# Patient Record
Sex: Male | Born: 1980
Health system: Southern US, Community
[De-identification: ages and names within clinical notes are randomized; demographics above are authoritative.]

## PROBLEM LIST (undated history)

## (undated) DIAGNOSIS — R109 Unspecified abdominal pain: Secondary | ICD-10-CM

## (undated) DIAGNOSIS — R079 Chest pain, unspecified: Secondary | ICD-10-CM

## (undated) DIAGNOSIS — R509 Fever, unspecified: Secondary | ICD-10-CM

## (undated) DIAGNOSIS — L600 Ingrowing nail: Secondary | ICD-10-CM

## (undated) HISTORY — DX: Fever, unspecified: R50.9

## (undated) HISTORY — DX: Unspecified abdominal pain: R10.9

## (undated) HISTORY — PX: TONSILLECTOMY: SUR1361

---
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
## 2009-02-26 ENCOUNTER — Emergency Department (HOSPITAL_BASED_OUTPATIENT_CLINIC_OR_DEPARTMENT_OTHER): Admission: EM | Admit: 2009-02-26 | Discharge: 2009-02-26 | Payer: Self-pay | Admitting: Emergency Medicine

## 2010-10-01 ENCOUNTER — Other Ambulatory Visit: Payer: Self-pay | Admitting: Sports Medicine

## 2010-10-01 DIAGNOSIS — M25519 Pain in unspecified shoulder: Secondary | ICD-10-CM

## 2010-10-10 ENCOUNTER — Ambulatory Visit
Admission: RE | Admit: 2010-10-10 | Discharge: 2010-10-10 | Disposition: A | Discharge status: Home / Self Care | Payer: Self-pay | Source: Ambulatory Visit | Attending: Sports Medicine | Admitting: Sports Medicine

## 2010-10-10 ENCOUNTER — Ambulatory Visit
Admission: RE | Admit: 2010-10-10 | Discharge: 2010-10-10 | Disposition: A | Discharge status: Home / Self Care | Payer: 59 | Source: Ambulatory Visit | Attending: Sports Medicine | Admitting: Sports Medicine

## 2010-10-10 DIAGNOSIS — M25519 Pain in unspecified shoulder: Secondary | ICD-10-CM

## 2013-04-23 ENCOUNTER — Ambulatory Visit (INDEPENDENT_AMBULATORY_CARE_PROVIDER_SITE_OTHER): Payer: 59 | Admitting: Family Medicine

## 2013-04-23 ENCOUNTER — Encounter: Payer: Self-pay | Admitting: Family Medicine

## 2013-04-23 VITALS — BP 120/80 | HR 78 | Temp 97.5°F | Resp 18 | Wt 201.0 lb

## 2013-04-23 DIAGNOSIS — L600 Ingrowing nail: Secondary | ICD-10-CM

## 2013-04-23 DIAGNOSIS — L03031 Cellulitis of right toe: Secondary | ICD-10-CM

## 2013-04-23 DIAGNOSIS — L03039 Cellulitis of unspecified toe: Secondary | ICD-10-CM

## 2013-04-23 MED ORDER — HYDROCODONE-ACETAMINOPHEN 5-325 MG PO TABS: 1.0000 | ORAL_TABLET | Freq: Four times a day (QID) | ORAL | Status: DC | PRN

## 2013-04-23 MED ORDER — CEPHALEXIN 500 MG PO CAPS: 500.0000 mg | ORAL_CAPSULE | Freq: Two times a day (BID) | ORAL | Status: DC

## 2013-04-23 NOTE — Progress Notes (Signed)
  Subjective:    Patient ID: Eric Davis, male    DOB: 1980-12-30, 32 y.o.   MRN: [id number]  HPI Patient here with right great toe pain for the past 2 weeks. He went to the beach 2 weeks ago when he returned he noticed redness and swelling of his toe. He's been having yellow pus coming out from beneath the nail. He's been soaking the toe in Epsom salts and then he noticed the skin sloughing off. He has a lot of pain when he puts pressure around the toe nail or when he walks in his work boots. He denies any ankle pain or foot pain otherwise. He denies any fever or systemic symptoms.   Review of Systems  GEN- denies fatigue, fever, weight loss,weakness, recent illness MSK- + joint pain, muscle aches, injury       Objective:   Physical Exam  GEN-NAD,alert and oriented x 3 EXT- Right great toe- ingrown medial nail with erythema and dry pus at nailfolds both medially and on proximal nailfold, sloughing skin, TTP, FROM Toe joint, RIght ankle Pulse- DP 2+   Procedure- Partial Nail removal- Right Great ToeNail Procedure explained to patient questions answered benefits and risks discussed verbal consent obtained. Antiseptic-Betadine Anesthesia-lidocaine without epinephrine- digital block  Medial aspect of right great toenail -elevated and removed, minimal bleeding, no pus expressed Minimal blood loss Patient tolerated procedure well Vaseline Gauze and Bandage applied See instructions        Assessment & Plan:

## 2013-04-23 NOTE — Patient Instructions (Addendum)
Change bandage every 24 hours Use antibiotic cream to keep bandage from sticking Elevate foot to help with any swelling Take antibiotics by mouth and pain pills as needed F/U if anything gets worse   Infected Ingrown Toenail An infected ingrown toenail occurs when the nail edge grows into the skin and bacteria invade the area. Symptoms include pain, tenderness, swelling, and pus drainage from the edge of the nail. Poorly fitting shoes, minor injuries, and improper cutting of the toenail may also contribute to the problem. You should cut your toenails squarely instead of rounding the edges. Do not cut them too short. Avoid tight or pointed toe shoes. Sometimes the ingrown portion of the nail must be removed. If your toenail is removed, it can take 3-4 months for it to re-grow. HOME CARE INSTRUCTIONS   Soak your infected toe in warm water for 20-30 minutes, 2 to 3 times a day.  Packing or dressings applied to the area should be changed daily.  Take medicine as directed and finish them.  Reduce activities and keep your foot elevated when able to reduce swelling and discomfort. Do this until the infection gets better.  Wear sandals or go barefoot as much as possible while the infected area is sensitive.  See your caregiver for follow-up care in 2-3 days if the infection is not better. SEEK MEDICAL CARE IF:  Your toe is becoming more red, swollen or painful. MAKE SURE YOU:   Understand these instructions.  Will watch your condition.  Will get help right away if you are not doing well or get worse. Document Released: 09/05/2004 Document Revised: 10/21/2011 Document Reviewed: 07/25/2008 [facility] Patient Information 2014 [place], [place].

## 2013-04-25 DIAGNOSIS — L600 Ingrowing nail: Secondary | ICD-10-CM | POA: Insufficient documentation

## 2013-04-25 DIAGNOSIS — L03039 Cellulitis of unspecified toe: Secondary | ICD-10-CM

## 2013-04-25 HISTORY — DX: Ingrowing nail: L60.0

## 2013-04-25 HISTORY — DX: Cellulitis of unspecified toe: L03.039

## 2013-04-25 NOTE — Assessment & Plan Note (Signed)
Keflex, unable to express any gross pus

## 2013-04-25 NOTE — Assessment & Plan Note (Signed)
Partial removal of ingrown nail on infected side See instructions

## 2013-12-28 ENCOUNTER — Encounter: Payer: Self-pay | Admitting: Family Medicine

## 2013-12-28 ENCOUNTER — Emergency Department (HOSPITAL_BASED_OUTPATIENT_CLINIC_OR_DEPARTMENT_OTHER)
Admission: EM | Admit: 2013-12-28 | Discharge: 2013-12-29 | Disposition: A | Discharge status: Home / Self Care | Payer: 59 | Attending: Emergency Medicine | Admitting: Emergency Medicine

## 2013-12-28 ENCOUNTER — Encounter (HOSPITAL_BASED_OUTPATIENT_CLINIC_OR_DEPARTMENT_OTHER): Payer: Self-pay | Admitting: Emergency Medicine

## 2013-12-28 ENCOUNTER — Ambulatory Visit (INDEPENDENT_AMBULATORY_CARE_PROVIDER_SITE_OTHER): Payer: 59 | Admitting: Family Medicine

## 2013-12-28 VITALS — BP 100/80 | HR 69 | Temp 98.0°F | Resp 14 | Ht 74.0 in | Wt 200.0 lb

## 2013-12-28 DIAGNOSIS — K2990 Gastroduodenitis, unspecified, without bleeding: Principal | ICD-10-CM

## 2013-12-28 DIAGNOSIS — Z792 Long term (current) use of antibiotics: Secondary | ICD-10-CM | POA: Insufficient documentation

## 2013-12-28 DIAGNOSIS — K859 Acute pancreatitis without necrosis or infection, unspecified: Secondary | ICD-10-CM

## 2013-12-28 DIAGNOSIS — K2970 Gastritis, unspecified, without bleeding: Secondary | ICD-10-CM | POA: Insufficient documentation

## 2013-12-28 DIAGNOSIS — Z79899 Other long term (current) drug therapy: Secondary | ICD-10-CM | POA: Insufficient documentation

## 2013-12-28 DIAGNOSIS — R1011 Right upper quadrant pain: Secondary | ICD-10-CM | POA: Insufficient documentation

## 2013-12-28 LAB — CBC WITH DIFFERENTIAL/PLATELET
BASOS PCT: 1 % (ref 0–1)
Basophils Absolute: 0 10*3/uL (ref 0.0–0.1)
Basophils Absolute: 0 10*3/uL (ref 0.0–0.1)
Basophils Relative: 1 % (ref 0–1)
EOS ABS: 0.1 10*3/uL (ref 0.0–0.7)
EOS PCT: 2 % (ref 0–5)
Eosinophils Absolute: 0.1 10*3/uL (ref 0.0–0.7)
Eosinophils Relative: 2 % (ref 0–5)
HCT: 38.7 % — ABNORMAL LOW (ref 39.0–52.0)
HEMATOCRIT: 39.7 % (ref 39.0–52.0)
HEMOGLOBIN: 13.9 g/dL (ref 13.0–17.0)
HEMOGLOBIN: 13.5 g/dL (ref 13.0–17.0)
LYMPHS ABS: 1.6 10*3/uL (ref 0.7–4.0)
LYMPHS PCT: 34 % (ref 12–46)
Lymphocytes Relative: 31 % (ref 12–46)
Lymphs Abs: 1.7 10*3/uL (ref 0.7–4.0)
MCH: 28.3 pg (ref 26.0–34.0)
MCH: 29.3 pg (ref 26.0–34.0)
MCHC: 35 g/dL (ref 30.0–36.0)
MCHC: 34.9 g/dL (ref 30.0–36.0)
MCV: 80.9 fL (ref 78.0–100.0)
MCV: 84.1 fL (ref 78.0–100.0)
MONO ABS: 0.8 10*3/uL (ref 0.1–1.0)
MONOS PCT: 16 % — AB (ref 3–12)
MONOS PCT: 14 % — AB (ref 3–12)
Monocytes Absolute: 0.8 10*3/uL (ref 0.1–1.0)
NEUTROS ABS: 2.3 10*3/uL (ref 1.7–7.7)
Neutro Abs: 2.9 10*3/uL (ref 1.7–7.7)
Neutrophils Relative %: 47 % (ref 43–77)
Neutrophils Relative %: 52 % (ref 43–77)
Platelets: 235 10*3/uL (ref 150–400)
Platelets: 218 10*3/uL (ref 150–400)
RBC: 4.91 MIL/uL (ref 4.22–5.81)
RBC: 4.6 MIL/uL (ref 4.22–5.81)
RDW: 13.6 % (ref 11.5–15.5)
RDW: 13 % (ref 11.5–15.5)
WBC: 4.8 10*3/uL (ref 4.0–10.5)
WBC: 5.5 10*3/uL (ref 4.0–10.5)

## 2013-12-28 LAB — COMPLETE METABOLIC PANEL WITH GFR
ALT: 24 U/L (ref 0–53)
AST: 25 U/L (ref 0–37)
Albumin: 4.2 g/dL (ref 3.5–5.2)
Alkaline Phosphatase: 65 U/L (ref 39–117)
BILIRUBIN TOTAL: 0.7 mg/dL (ref 0.2–1.2)
BUN: 12 mg/dL (ref 6–23)
CO2: 27 mEq/L (ref 19–32)
Calcium: 9.4 mg/dL (ref 8.4–10.5)
Chloride: 102 mEq/L (ref 96–112)
Creat: 0.98 mg/dL (ref 0.50–1.35)
GFR, Est African American: >89 mL/min
GLUCOSE: 94 mg/dL (ref 70–99)
POTASSIUM: 4.7 meq/L (ref 3.5–5.3)
Sodium: 138 mEq/L (ref 135–145)
Total Protein: 6.7 g/dL (ref 6.0–8.3)

## 2013-12-28 LAB — URINALYSIS, ROUTINE W REFLEX MICROSCOPIC
BILIRUBIN URINE: NEGATIVE
GLUCOSE, UA: NEGATIVE mg/dL
HGB URINE DIPSTICK: NEGATIVE
KETONES UR: NEGATIVE mg/dL
Leukocytes, UA: NEGATIVE
Nitrite: NEGATIVE
PH: 6 (ref 5.0–8.0)
Protein, ur: NEGATIVE mg/dL
Specific Gravity, Urine: 1.01 (ref 1.005–1.030)
Urobilinogen, UA: 1 mg/dL (ref 0.0–1.0)

## 2013-12-28 LAB — COMPREHENSIVE METABOLIC PANEL
ALBUMIN: 4.1 g/dL (ref 3.5–5.2)
ALT: 23 U/L (ref 0–53)
AST: 26 U/L (ref 0–37)
Alkaline Phosphatase: 66 U/L (ref 39–117)
BILIRUBIN TOTAL: 0.7 mg/dL (ref 0.3–1.2)
BUN: 10 mg/dL (ref 6–23)
CO2: 29 mEq/L (ref 19–32)
CREATININE: 1 mg/dL (ref 0.50–1.35)
Calcium: 9.5 mg/dL (ref 8.4–10.5)
Chloride: 102 mEq/L (ref 96–112)
GFR calc non Af Amer: >90 mL/min (ref >90)
Glucose, Bld: 102 mg/dL — ABNORMAL HIGH (ref 70–99)
Potassium: 3.7 mEq/L (ref 3.7–5.3)
Sodium: 142 mEq/L (ref 137–147)
Total Protein: 7.2 g/dL (ref 6.0–8.3)

## 2013-12-28 LAB — LIPASE, BLOOD: LIPASE: 34 U/L (ref 11–59)

## 2013-12-28 LAB — LIPASE: Lipase: 14 U/L (ref 0–75)

## 2013-12-28 MED ORDER — KETOROLAC TROMETHAMINE 30 MG/ML IJ SOLN
30.0000 mg | Freq: Once | INTRAMUSCULAR | Status: AC
  Administered 2013-12-28: 30 mg via INTRAVENOUS
  Filled 2013-12-28: qty 1

## 2013-12-28 MED ORDER — PROMETHAZINE HCL 25 MG PO TABS: 25.0000 mg | ORAL_TABLET | Freq: Four times a day (QID) | ORAL | Status: DC | PRN

## 2013-12-28 MED ORDER — DICYCLOMINE HCL 10 MG/ML IM SOLN
20.0000 mg | Freq: Once | INTRAMUSCULAR | Status: AC
  Administered 2013-12-28: 20 mg via INTRAMUSCULAR
  Filled 2013-12-28: qty 2

## 2013-12-28 MED ORDER — PANTOPRAZOLE SODIUM 40 MG PO TBEC: 40.0000 mg | DELAYED_RELEASE_TABLET | Freq: Every day | ORAL | Status: DC

## 2013-12-28 MED ORDER — SODIUM CHLORIDE 0.9 % IV SOLN
Freq: Once | INTRAVENOUS | Status: AC
  Administered 2013-12-28: 23:00:00 via INTRAVENOUS

## 2013-12-28 MED ORDER — SODIUM CHLORIDE 0.9 % IV BOLUS (SEPSIS)
1000.0000 mL | Freq: Once | INTRAVENOUS | Status: AC
  Administered 2013-12-28: 1000 mL via INTRAVENOUS

## 2013-12-28 MED ORDER — ONDANSETRON HCL 4 MG/2ML IJ SOLN
4.0000 mg | Freq: Once | INTRAMUSCULAR | Status: AC
  Administered 2013-12-28: 4 mg via INTRAVENOUS
  Filled 2013-12-28: qty 2

## 2013-12-28 NOTE — ED Notes (Signed)
MD at bedside. 

## 2013-12-28 NOTE — ED Provider Notes (Signed)
CSN: [id number]     Arrival date & time 12/28/13  1930 History  This chart was scribed for Zaydenn Balaguer Smitty CordsK Daisuke Bailey-Rasch, MD by Dorothey Basemania Sutton, ED Scribe. This patient was seen in room MH08/MH08 and the patient's care was started at 11:04 PM.    Chief Complaint  Patient presents with  . Abdominal Pain   Patient is a 33 y.o. male presenting with abdominal pain. The history is provided by the patient. No language interpreter was used.  Abdominal Pain Pain location:  Epigastric and RUQ Pain quality: cramping   Pain radiates to:  Does not radiate Pain severity:  Moderate Onset quality:  Gradual Timing:  Constant Progression:  Unchanged Chronicity:  New Context: alcohol use   Relieved by:  None tried Ineffective treatments:  None tried Associated symptoms: diarrhea and nausea   Associated symptoms: no fever and no hematuria   Diarrhea:    Quality:  Watery   Severity:  Mild   Timing:  Intermittent   Progression:  Unchanged Nausea:    Severity:  Mild   Onset quality:  Gradual   Timing:  Constant   Progression:  Unchanged Risk factors: has not had multiple surgeries    HPI Comments: Eric Davis is a 33 y.o. male who presents to the Emergency Department complaining of an pain to the epigastrum and RUQ of the abdomen with associated nausea, decreased appetite, and watery diarrhea onset 4 days ago. He reports that he was outside in the sun a lot and was drinking the day before his symptoms presented. He reports that he was seen by his PCP earlier today and received IV fluids, but was advised to come to the ED if his symptoms did not improve. He denies dysuria, hematuria. Patient has no other pertinent medical history.   History reviewed. No pertinent past medical history. History reviewed. No pertinent past surgical history. Family History  Problem Relation Age of Onset  . Lung disease Maternal Grandfather    History  Substance Use Topics  . Smoking status: Never Smoker   . Smokeless  tobacco: Not on file  . Alcohol Use: Yes     Comment: rarely    Review of Systems  Constitutional: Positive for appetite change (decreased). Negative for fever.  Gastrointestinal: Positive for nausea, abdominal pain and diarrhea.  Genitourinary: Negative for hematuria.  All other systems reviewed and are negative.     Allergies  Review of patient's allergies indicates no known allergies.  Home Medications   Prior to Admission medications   Medication Sig Start Date End Date Taking? Authorizing Provider  pantoprazole (PROTONIX) 40 MG tablet Take 40 mg by mouth daily.   Yes Historical Provider, MD  promethazine (PHENERGAN) 25 MG tablet Take 25 mg by mouth every 6 (six) hours as needed for nausea or vomiting.   Yes Historical Provider, MD  cephALEXin (KEFLEX) 500 MG capsule Take 1 capsule (500 mg total) by mouth 2 (two) times daily. 04/23/13   Salley ScarletKawanta F Isabela, MD  HYDROcodone-acetaminophen (NORCO) 5-325 MG per tablet Take 1 tablet by mouth every 6 (six) hours as needed for pain. 04/23/13   Salley ScarletKawanta F [place], MD   Triage Vitals: BP 117/76  Pulse 70  Temp(Src) 98.8 F (37.1 C) (Oral)  Resp 18  Ht 6\' 2"  (1.88 m)  Wt 200 lb (90.719 kg)  BMI 25.67 kg/m2  SpO2 100%  Physical Exam  Nursing note and vitals reviewed. Constitutional: He is oriented to person, place, and time. He appears well-developed and well-nourished.  No distress.  HENT:  Head: Normocephalic and atraumatic.  Mouth/Throat: Oropharynx is clear and moist.  Eyes: Conjunctivae are normal. Pupils are equal, round, and reactive to light.  Neck: Normal range of motion. Neck supple.  Cardiovascular: Normal rate, regular rhythm, normal heart sounds and intact distal pulses.   Pulmonary/Chest: Effort normal and breath sounds normal. No respiratory distress. He has no wheezes.  Abdominal: Soft. He exhibits no distension. There is no tenderness. There is no rebound and no guarding.  Hyperactive bowel sounds.   Musculoskeletal:  Normal range of motion.  Neurological: He is alert and oriented to person, place, and time.  Skin: Skin is warm and dry. He is not diaphoretic.  Psychiatric: He has a normal mood and affect. His behavior is normal.    ED Course  Procedures (including critical care time)  DIAGNOSTIC STUDIES: Oxygen Saturation is 100% on room air, normal by my interpretation.    COORDINATION OF CARE: 11:08 PM- Discussed treatment plan with patient at bedside and patient verbalized agreement.     Labs Review Labs Reviewed  CBC WITH DIFFERENTIAL - Abnormal; Notable for the following:    HCT 38.7 (*)    Monocytes Relative 14 (*)    All other components within normal limits  COMPREHENSIVE METABOLIC PANEL - Abnormal; Notable for the following:    Glucose, Bld 102 (*)    All other components within normal limits  URINALYSIS, ROUTINE W REFLEX MICROSCOPIC  LIPASE, BLOOD    Imaging Review No results found.   EKG Interpretation None      MDM   Final diagnoses:  None   Date: 12/29/2013  Rate: 66  Rhythm: normal sinus rhythm  QRS Axis: normal  Intervals: normal  ST/T Wave abnormalities: normal  Conduction Disutrbances: none  Narrative Interpretation: unremarkable      Symptoms are consistent gastritis, viral in nature.  Exam and vitals are benign and reassuring.  No indication for imaging at this time.  Feels better with medication.  Follow up with your family doctor for ongoing care.  Return for any new or worsening symptoms  I personally performed the services described in this documentation, which was scribed in my presence. The recorded information has been reviewed and is accurate.     Jasmine AweApril K Arlina Sabina-Rasch, MD 12/29/13 [phone]

## 2013-12-28 NOTE — Progress Notes (Signed)
Subjective:    Patient ID: Eric Davis, male    DOB: September 17, 1980, 33 y.o.   MRN: [id number]  HPI  Patient went to a wedding this weekend. He admits to drinking 4 beers at the wedding. He then reports drinking 3 or 4 hard liquor drinks that night.  Ever since that time he is felt extremely nauseated. He reports constant pain underneath the xiphoid process. Pain is mild 2-3/10, but dull, constant, and gnawing in nature.  He denies any right upper quadrant pain. He is not jaundiced. He has no scleral icterus. The pain does not come and go. It is constant. He feels like he is dehydrated because whenever he eats the pain gets worse. Therefore he has not been eating and drinking well the  last 4 days.  He denies any hematemesis, melena, or hematochezia. He has no history of alcohol abuse.  He has no history of gallstones or ulcers. No past medical history on file. No past surgical history on file. No current outpatient prescriptions on file prior to visit.   No current facility-administered medications on file prior to visit.   No Known Allergies History   Social History  . Marital Status: Married    Spouse Name: N/A    Number of Children: N/A  . Years of Education: N/A   Occupational History  . Not on file.   Social History Main Topics  . Smoking status: Never Smoker   . Smokeless tobacco: Not on file  . Alcohol Use: Yes  . Drug Use: No  . Sexual Activity: Not on file   Other Topics Concern  . Not on file   Social History Narrative  . No narrative on file   Dad has a history of hypertension.   Review of Systems  All other systems reviewed and are negative.      Objective:   Physical Exam  Vitals reviewed. Constitutional: He appears well-developed and well-nourished. No distress.  HENT:  Mouth/Throat: Oropharynx is clear and moist. No oropharyngeal exudate.  Eyes: Conjunctivae and EOM are normal. Pupils are equal, round, and reactive to light. No scleral icterus.  Neck:  Normal range of motion. Neck supple.  Cardiovascular: Normal rate, regular rhythm and normal heart sounds.  Exam reveals no gallop and no friction rub.   No murmur heard. Pulmonary/Chest: Effort normal and breath sounds normal. No respiratory distress. He has no wheezes. He has no rales.  Abdominal: Soft. Bowel sounds are normal. He exhibits no distension and no mass. There is tenderness. There is no rebound and no guarding.  Lymphadenopathy:    He has no cervical adenopathy.  Skin: He is not diaphoretic.   patient is tender to palpation in the epigastric area        Assessment & Plan:  1. Pancreatitis, acute I suspect pancreatitis compounded by dehydration. I will check a CBC, CMP, lipase. I recommended bowel rest. The patient is only to drink clear liquids for the next 24 hours. Abdominal pain improves, he can progress his diet to clear solids and gradually increase his diet as tolerated. He can use Phenergan 25 mg every 6 hours when necessary nausea. I also start the patient on protonix 40 mg poqday to cover possible gastritis. Recheck in 48 hours if no better or sooner if worse.  Differential diagnosis includes gastritis, ulcer, biliary tract disease, pancreatitis. - COMPLETE METABOLIC PANEL WITH GFR - CBC with Differential - Lipase - promethazine (PHENERGAN) 25 MG tablet; Take 1 tablet (25 mg total)  by mouth every 6 (six) hours as needed for nausea or vomiting.  Dispense: 20 tablet; Refill: 0 - pantoprazole (PROTONIX) 40 MG tablet; Take 1 tablet (40 mg total) by mouth daily.  Dispense: 30 tablet; Refill: 3

## 2013-12-28 NOTE — ED Notes (Signed)
Pt states he was seen by PCP this am-labs sent out and IV NS given

## 2013-12-28 NOTE — ED Notes (Signed)
Pt reports nausea and abdominal pain with diarrhea.  Pt reports first time in the sun and drinking some alcohol in a long time was Friday and woke up Saturday with abdominal pain and diarrhea/nausea.  Pt reports that he has no appetite.  Pt reports hasnt really ate anything in 4 days.

## 2013-12-29 ENCOUNTER — Encounter (HOSPITAL_BASED_OUTPATIENT_CLINIC_OR_DEPARTMENT_OTHER): Payer: Self-pay | Admitting: Emergency Medicine

## 2013-12-29 MED ORDER — ONDANSETRON 8 MG PO TBDP: ORAL_TABLET | ORAL | Status: DC

## 2013-12-29 MED ORDER — SUCRALFATE 1 GM/10ML PO SUSP: 1.0000 g | Freq: Three times a day (TID) | ORAL | Status: DC

## 2013-12-29 NOTE — Discharge Instructions (Signed)

## 2014-01-12 ENCOUNTER — Encounter: Payer: Self-pay | Admitting: Family Medicine

## 2014-06-01 ENCOUNTER — Encounter: Payer: Self-pay | Admitting: Physician Assistant

## 2014-06-01 ENCOUNTER — Ambulatory Visit (INDEPENDENT_AMBULATORY_CARE_PROVIDER_SITE_OTHER): Payer: 59 | Admitting: Physician Assistant

## 2014-06-01 VITALS — BP 122/76 | HR 76 | Temp 98.7°F | Resp 18 | Wt 205.0 lb

## 2014-06-01 DIAGNOSIS — R509 Fever, unspecified: Secondary | ICD-10-CM

## 2014-06-01 DIAGNOSIS — J029 Acute pharyngitis, unspecified: Secondary | ICD-10-CM

## 2014-06-01 DIAGNOSIS — J020 Streptococcal pharyngitis: Secondary | ICD-10-CM

## 2014-06-01 LAB — RAPID STREP SCREEN (MED CTR MEBANE ONLY): Streptococcus, Group A Screen (Direct): POSITIVE — AB

## 2014-06-01 LAB — INFLUENZA A AND B
Inflenza A Ag: NEGATIVE
Influenza B Ag: NEGATIVE

## 2014-06-01 MED ORDER — AMOXICILLIN 500 MG PO CAPS: 500.0000 mg | ORAL_CAPSULE | Freq: Three times a day (TID) | ORAL | Status: DC

## 2014-06-01 NOTE — Progress Notes (Signed)
Patient ID: Donalynn Furlongndrew S Offutt MRN: [id number], DOB: 12-23-1980, 33 y.o. Date of Encounter: 06/01/2014, 12:29 PM    Chief Complaint:  Chief Complaint  Patient presents with  . sick    c/o sorethroat, fever, achy all over x 1 day     HPI: 33 y.o. year old white male with above symptoms. Says he feels absolutely horrible. says he is feeling chills and body aches all over and his throat feels really sore. Says he did have fever or back his temperatures remained normal here because he just take some Tylenol and Motrin.     Home Meds:   Outpatient Prescriptions Prior to Visit  Medication Sig Dispense Refill  . pantoprazole (PROTONIX) 40 MG tablet Take 40 mg by mouth daily.      . sucralfate (CARAFATE) 1 GM/10ML suspension Take 10 mLs (1 g total) by mouth 4 (four) times daily -  with meals and at bedtime.  420 mL  0  . cephALEXin (KEFLEX) 500 MG capsule Take 1 capsule (500 mg total) by mouth 2 (two) times daily.  14 capsule  0  . HYDROcodone-acetaminophen (NORCO) 5-325 MG per tablet Take 1 tablet by mouth every 6 (six) hours as needed for pain.  10 tablet  0  . ondansetron (ZOFRAN ODT) 8 MG disintegrating tablet 8mg  ODT q8  hours prn nausea  6 tablet  0  . promethazine (PHENERGAN) 25 MG tablet Take 25 mg by mouth every 6 (six) hours as needed for nausea or vomiting.       No facility-administered medications prior to visit.    Allergies: No Known Allergies    Review of Systems: See HPI for pertinent ROS. All other ROS negative.    Physical Exam: Blood pressure 122/76, pulse 76, temperature 98.7 F (37.1 C), temperature source Oral, resp. rate 18, weight 205 lb (92.987 kg)., Body mass index is 26.31 kg/(m^2). General: WNWD WM.  Appears in no acute distress. HEENT: Normocephalic, atraumatic, eyes without discharge, sclera non-icteric, nares are without discharge. Bilateral auditory canals clear, TM's are without perforation, pearly grey and translucent with reflective cone of light  bilaterally. Oral cavity moist, tonsils with moderate erythema but no exudate.No peritonsillar abscess.  Neck: Supple. No thyromegaly. No lymphadenopathy. Lungs: Clear bilaterally to auscultation without wheezes, rales, or rhonchi. Breathing is unlabored. Heart: Regular rhythm. No murmurs, rubs, or gallops. Msk:  Strength and tone normal for age. Extremities/Skin: Warm and dry.  No rashes. Neuro: Alert and oriented X 3. Moves all extremities spontaneously. Gait is normal. CNII-XII grossly in tact. Psych:  Responds to questions appropriately with a normal affect.   Results for orders placed in visit on 06/01/14  RAPID STREP SCREEN      Result Value Ref Range   Source THROAT     Streptococcus, Group A Screen (Direct) POS (*) NEGATIVE  INFLUENZA A AND B      Result Value Ref Range   Source-INFBD NASAL     Inflenza A Ag NEG  Negative   Influenza B Ag NEG  Negative     ASSESSMENT AND PLAN:  33 y.o. year old male with  1. Strep pharyngitis - amoxicillin (AMOXIL) 500 MG capsule; Take 1 capsule (500 mg total) by mouth 3 (three) times daily.  Dispense: 30 capsule; Refill: 0 He is to complete all of the antibiotic as directed. He can continue to take Tylenol and Motrin to relieve some of the sore throat pain as well as some myalgias and fever. Can also  use lozenges and spray as needed for sore throat pain. Followup as needed.  2. Sorethroat - Rapid Strep Screen - Influenza a and b  3. Fever and chills - Rapid Strep Screen - Influenza a and b   Signed, [address]Mary Beth Winter [place], [place], [facility] 06/01/2014 12:29 PM

## 2014-12-16 ENCOUNTER — Emergency Department (HOSPITAL_COMMUNITY): Payer: 59

## 2014-12-16 ENCOUNTER — Ambulatory Visit (INDEPENDENT_AMBULATORY_CARE_PROVIDER_SITE_OTHER): Payer: 59 | Admitting: Family Medicine

## 2014-12-16 ENCOUNTER — Observation Stay (HOSPITAL_COMMUNITY)
Admission: EM | Admit: 2014-12-16 | Discharge: 2014-12-17 | Disposition: A | Discharge status: Home / Self Care | Payer: 59 | Attending: Internal Medicine | Admitting: Internal Medicine

## 2014-12-16 ENCOUNTER — Encounter (HOSPITAL_COMMUNITY): Payer: Self-pay | Admitting: Emergency Medicine

## 2014-12-16 ENCOUNTER — Encounter: Payer: Self-pay | Admitting: Family Medicine

## 2014-12-16 VITALS — BP 122/74 | HR 76 | Temp 98.3°F | Resp 14 | Ht 74.0 in | Wt 208.0 lb

## 2014-12-16 DIAGNOSIS — R52 Pain, unspecified: Secondary | ICD-10-CM

## 2014-12-16 DIAGNOSIS — R1010 Upper abdominal pain, unspecified: Secondary | ICD-10-CM | POA: Diagnosis not present

## 2014-12-16 DIAGNOSIS — R109 Unspecified abdominal pain: Secondary | ICD-10-CM

## 2014-12-16 DIAGNOSIS — R079 Chest pain, unspecified: Principal | ICD-10-CM | POA: Insufficient documentation

## 2014-12-16 DIAGNOSIS — R509 Fever, unspecified: Secondary | ICD-10-CM | POA: Diagnosis not present

## 2014-12-16 DIAGNOSIS — R0781 Pleurodynia: Secondary | ICD-10-CM | POA: Diagnosis not present

## 2014-12-16 DIAGNOSIS — R000 Tachycardia, unspecified: Secondary | ICD-10-CM | POA: Insufficient documentation

## 2014-12-16 DIAGNOSIS — J989 Respiratory disorder, unspecified: Secondary | ICD-10-CM | POA: Diagnosis not present

## 2014-12-16 HISTORY — DX: Unspecified abdominal pain: R10.9

## 2014-12-16 LAB — COMPREHENSIVE METABOLIC PANEL
ALBUMIN: 4.1 g/dL (ref 3.5–5.0)
ALT: 42 U/L (ref 17–63)
ANION GAP: 7 (ref 5–15)
AST: 39 U/L (ref 15–41)
Alkaline Phosphatase: 60 U/L (ref 38–126)
BUN: 15 mg/dL (ref 6–20)
CO2: 25 mmol/L (ref 22–32)
CREATININE: 0.93 mg/dL (ref 0.61–1.24)
Calcium: 8.9 mg/dL (ref 8.9–10.3)
Chloride: 106 mmol/L (ref 101–111)
GFR calc Af Amer: >60 mL/min (ref >60)
GFR calc non Af Amer: >60 mL/min (ref >60)
Glucose, Bld: 115 mg/dL — ABNORMAL HIGH (ref 70–99)
Potassium: 3.7 mmol/L (ref 3.5–5.1)
Sodium: 138 mmol/L (ref 135–145)
TOTAL PROTEIN: 7.1 g/dL (ref 6.5–8.1)
Total Bilirubin: 0.9 mg/dL (ref 0.3–1.2)

## 2014-12-16 LAB — RAPID STREP SCREEN (MED CTR MEBANE ONLY): Streptococcus, Group A Screen (Direct): NEGATIVE

## 2014-12-16 LAB — CBC WITH DIFFERENTIAL/PLATELET
Basophils Absolute: 0 10*3/uL (ref 0.0–0.1)
Basophils Relative: 0 % (ref 0–1)
EOS PCT: 0 % (ref 0–5)
Eosinophils Absolute: 0 10*3/uL (ref 0.0–0.7)
HEMATOCRIT: 39.1 % (ref 39.0–52.0)
Hemoglobin: 13.3 g/dL (ref 13.0–17.0)
LYMPHS PCT: 10 % — AB (ref 12–46)
Lymphs Abs: 0.6 10*3/uL — ABNORMAL LOW (ref 0.7–4.0)
MCH: 28.5 pg (ref 26.0–34.0)
MCHC: 34 g/dL (ref 30.0–36.0)
MCV: 83.9 fL (ref 78.0–100.0)
MONOS PCT: 7 % (ref 3–12)
Monocytes Absolute: 0.4 10*3/uL (ref 0.1–1.0)
Neutro Abs: 4.7 10*3/uL (ref 1.7–7.7)
Neutrophils Relative %: 83 % — ABNORMAL HIGH (ref 43–77)
Platelets: 191 10*3/uL (ref 150–400)
RBC: 4.66 MIL/uL (ref 4.22–5.81)
RDW: 12.7 % (ref 11.5–15.5)
WBC: 5.7 10*3/uL (ref 4.0–10.5)

## 2014-12-16 LAB — URINALYSIS, ROUTINE W REFLEX MICROSCOPIC
Bilirubin Urine: NEGATIVE
GLUCOSE, UA: NEGATIVE mg/dL
Hgb urine dipstick: NEGATIVE
Ketones, ur: NEGATIVE mg/dL
LEUKOCYTES UA: NEGATIVE
Nitrite: NEGATIVE
PH: 6 (ref 5.0–8.0)
Protein, ur: NEGATIVE mg/dL
SPECIFIC GRAVITY, URINE: 1.009 (ref 1.005–1.030)
Urobilinogen, UA: 0.2 mg/dL (ref 0.0–1.0)

## 2014-12-16 LAB — INFLUENZA A AND B
INFLUENZA B AG: NEGATIVE
Inflenza A Ag: NEGATIVE

## 2014-12-16 LAB — LIPASE, BLOOD: LIPASE: 32 U/L (ref 22–51)

## 2014-12-16 LAB — I-STAT TROPONIN, ED: Troponin i, poc: 0 ng/mL (ref 0.00–0.08)

## 2014-12-16 LAB — D-DIMER, QUANTITATIVE (NOT AT ARMC): D DIMER QUANT: 0.27 ug{FEU}/mL (ref 0.00–0.48)

## 2014-12-16 MED ORDER — ONDANSETRON HCL 4 MG/2ML IJ SOLN
4.0000 mg | Freq: Once | INTRAMUSCULAR | Status: AC
  Administered 2014-12-16: 4 mg via INTRAVENOUS
  Filled 2014-12-16: qty 2

## 2014-12-16 MED ORDER — IOHEXOL 300 MG/ML  SOLN
100.0000 mL | Freq: Once | INTRAMUSCULAR | Status: AC | PRN
Start: 2014-12-16 — End: 2014-12-16
  Administered 2014-12-16: 100 mL via INTRAVENOUS

## 2014-12-16 MED ORDER — IOHEXOL 300 MG/ML  SOLN
50.0000 mL | Freq: Once | INTRAMUSCULAR | Status: AC | PRN
  Administered 2014-12-16: 50 mL via ORAL

## 2014-12-16 MED ORDER — PANTOPRAZOLE SODIUM 40 MG IV SOLR
40.0000 mg | Freq: Two times a day (BID) | INTRAVENOUS | Status: DC
  Administered 2014-12-17: 40 mg via INTRAVENOUS
  Filled 2014-12-16 (×3): qty 40

## 2014-12-16 MED ORDER — HYDROMORPHONE HCL 1 MG/ML IJ SOLN
1.0000 mg | Freq: Once | INTRAMUSCULAR | Status: AC
  Administered 2014-12-16: 1 mg via INTRAVENOUS
  Filled 2014-12-16: qty 1

## 2014-12-16 MED ORDER — PANTOPRAZOLE SODIUM 40 MG IV SOLR
40.0000 mg | Freq: Once | INTRAVENOUS | Status: AC
  Administered 2014-12-16: 40 mg via INTRAVENOUS
  Filled 2014-12-16: qty 40

## 2014-12-16 MED ORDER — GI COCKTAIL ~~LOC~~
30.0000 mL | Freq: Once | ORAL | Status: AC
  Administered 2014-12-17: 30 mL via ORAL
  Filled 2014-12-16: qty 30

## 2014-12-16 MED ORDER — HYDROMORPHONE HCL 1 MG/ML IJ SOLN
1.0000 mg | INTRAMUSCULAR | Status: DC | PRN
  Administered 2014-12-17: 1 mg via INTRAVENOUS
  Filled 2014-12-16 (×2): qty 1

## 2014-12-16 MED ORDER — AZITHROMYCIN 250 MG PO TABS
ORAL_TABLET | ORAL | Status: DC
Start: 2014-12-16 — End: 2014-12-17

## 2014-12-16 NOTE — ED Notes (Addendum)
Per EMS-c/o bilateral lower chest pain radiating to bilateral sides of pain x4 days. Went to PCP today at 1600-said they couldn't do a chest x-ray. Was given a prescription for Z-Pac. Says he felt increasingly worse today. Lung sounds diminished on right lower. Temp 101.7. Fluids started. SpO2 98-100 % on RA. HR 97-100. Has had 1,000 mg PO Tylenol at 1830. Pain increases upon lying down.   Pt able to speak full/clear sentences. RR even/unlabored but reports if he moves at all he isn't able to breathe. No hx PNA. Has 36 year old child who has pink eye and who has "felt bad." Took ZPAK today and Ibuprofen this afternoon. Denies congestion/cough/headache/nausea/vomiting.

## 2014-12-16 NOTE — Progress Notes (Signed)
Patient ID: Eric Davis, male   DOB: February 11, 1981, 34 y.o.   MRN: [id number]   Subjective:    Patient ID: Eric Davis, male    DOB: February 11, 1981, 34 y.o.   MRN: [id number]  Patient presents for Illness  patient here with chest discomfort mostly with taking a deep breath for the past 2 days. He will cup Wednesday felt congested in the chest he also had fever all day he felt like he had injured his chest or describe the feeling is being in a car accident the soreness that she has. He denies any body aches or joint aches otherwise. He has had some mild sore throat had an ulcer in the back of his mouth which is resolving. Positive sick contact with his children who have had a viral illness one has conjunctivitis. He has been taking ibuprofen to keep his fever down but still just does not feel well. He is more concerned with the pain on both sides of his chest when he takes a deep breath. He has not had any significant cough. No GI symptoms.No rash    Review Of Systems:  GEN- denies fatigue, fever, weight loss,weakness, recent illness HEENT- denies eye drainage, change in vision, nasal discharge, CVS- denies chest pain, palpitations RESP- denies SOB, +cough, wheeze ABD- denies N/V, change in stools, abd pain GU- denies dysuria, hematuria, dribbling, incontinence MSK- denies joint pain, +muscle aches, injury Neuro- denies headache, dizziness, syncope, seizure activity       Objective:    BP 122/74 mmHg  Pulse 76  Temp(Src) 98.3 F (36.8 C) (Oral)  Resp 14  Ht 6\' 2"  (1.88 m)  Wt 208 lb (94.348 kg)  BMI 26.69 kg/m2 GEN- NAD, alert and oriented x3 HEENT- PERRL, EOMI, non injected sclera, pink conjunctiva, MMM, oropharynx injected, no exudates, bad gag reflex, difficult to see if any small ulcerations Neck- Supple, no LAD CVS- RRR, no murmur RESP-CTAB, 98% ABD-NABS,soft,NT,ND MSK- TTP bilat chest wall EXT- No edema Pulses- Radial, DP- 2+        Assessment & Plan:      Problem  List Items Addressed This Visit    None    Visit Diagnoses    Respiratory illness    -  Primary    Unable to get CXR due to time, Strep and flu neg, but pleuritic pain and fever concerning, note he had Ibuprofen before visit. Given Zpak, CXR mOnday if not imp    Relevant Orders    Rapid Strep Screen (Completed)    Influenza a and b (Completed)    DG Chest 2 View    Pleuritic chest pain        Doubt PE, normal sat no travel, he is exposed to some chemicals work at [employer]       Note: This dictation was prepared with Office managerDragon dictation along with smaller Lobbyistphrase technology. Any transcriptional errors that result from this process are unintentional.

## 2014-12-16 NOTE — Patient Instructions (Signed)
Get the chest xray  Ibuprofen as needed  I will call with results Strep test and flu test are negative  [address] , [place]  [place]

## 2014-12-16 NOTE — H&P (Signed)
PCP: Leo Grosser, MD    Referring provider Zammit   Chief Complaint:  Chest and abdominal pain   HPI: Eric Davis is a 34 y.o. male   has no past medical history on file.   Presented with  Patient reports 3 day history of lower chest pain/epigastric abdominal pain worse with deep breathing or raising his arms up. Patient self reports Fevers up to 102 and sore throat. Patient has been exposed to his 67-year-old which were ill early on. Denies any cough. Presented to his primary care provider office and was tested for strep and flu which were both negative.  He was given a Z-Pak and asked to follow-up for chest x-ray on Monday. Later on today patient called EMS. Temperature was up to 101.7. Chest x-ray as well as a CT scan of abdomen has been unremarkable except for mild mesenteric lymphadenopathy. D-dimer has been negative lipase normal. Troponin within normal limits EKG showing no evidence of ST the patient's Hospitalist was called for admission for chest and abdominal pain  Review of Systems:    Pertinent positives include:  Fevers, chills, abdominal pain,chest pain,   Constitutional:  No weight loss, night sweats,fatigue, weight loss  HEENT:  No headaches, Difficulty swallowing,Tooth/dental problems,Sore throat,  No sneezing, itching, ear ache, nasal congestion, post nasal drip,  Cardio-vascular:  No Orthopnea, PND, anasarca, dizziness, palpitations.no Bilateral lower extremity swelling  GI:  No heartburn, indigestion,  nausea, vomiting, diarrhea, change in bowel habits, loss of appetite, melena, blood in stool, hematemesis Resp:  no shortness of breath at rest. No dyspnea on exertion, No excess mucus, no productive cough, No non-productive cough, No coughing up of blood.No change in color of mucus.No wheezing. Skin:  no rash or lesions. No jaundice GU:  no dysuria, change in color of urine, no urgency or frequency. No straining to urinate.  No flank pain.    Musculoskeletal:  No joint pain or no joint swelling. No decreased range of motion. No back pain.  Psych:  No change in mood or affect. No depression or anxiety. No memory loss.  Neuro: no localizing neurological complaints, no tingling, no weakness, no double vision, no gait abnormality, no slurred speech, no confusion  Otherwise ROS are negative except for above, 10 systems were reviewed  Past Medical History: History reviewed. No pertinent past medical history. Past Surgical History  Procedure Laterality Date  . Tonsillectomy       Medications: Prior to Admission medications   Medication Sig Start Date End Date Taking? Authorizing Provider  azithromycin (ZITHROMAX) 250 MG tablet Take 2 tablets x 1 day, then 1 tab daily for 4 days 12/16/14  Yes Salley Scarlet, MD    Allergies:  No Known Allergies  Social History:  Ambulatory  independently   Lives at home   With family                  reports that he has never smoked. He has never used smokeless tobacco. He reports that he drinks alcohol. He reports that he does not use illicit drugs.    Family History: family history includes Lung disease in his maternal grandfather.    Physical Exam: Patient Vitals for the past 24 hrs:  BP Temp Temp src Pulse Resp SpO2 Height Weight  12/16/14 2220 108/58 mmHg - - 81 18 96 % - -  12/16/14 1907 118/67 mmHg 99.3 F (37.4 C) Oral 95 17 99 %  (1.905 m) 92.534 kg (204 lb)  1. General:  in No Acute distress 2. Psychological: Alert and Oriented 3. Head/ENT:     Dry Mucous Membranes                          Head Non traumatic, neck supple                          Normal  Dentition 4. SKIN:   decreased Skin turgor,  Skin clean Dry and intact no rash 5. Heart: Regular rate and rhythm no Murmur, Rub or gallop 6. Lungs: Clear to auscultation bilaterally, no wheezes or crackles   7. Abdomen: Soft, mild epigastric and RUQ pain and tenderness, Non distended 8. Lower extremities:  no clubbing, cyanosis, or edema 9. Neurologically Grossly intact, moving all 4 extremities equally 10. MSK: Normal range of motion  body mass index is 25.5 kg/(m^2).   Labs on Admission:   Results for orders placed or performed during the hospital encounter of 12/16/14 (from the past 24 hour(s))  CBC with Differential/Platelet     Status: Abnormal   Collection Time: 12/16/14  7:53 PM  Result Value Ref Range   WBC 5.7 4.0 - 10.5 K/uL   RBC 4.66 4.22 - 5.81 MIL/uL   Hemoglobin 13.3 13.0 - 17.0 g/dL   HCT 40.939.1 81.139.0 - 91.452.0 %   MCV 83.9 78.0 - 100.0 fL   MCH 28.5 26.0 - 34.0 pg   MCHC 34.0 30.0 - 36.0 g/dL   RDW 78.212.7 95.611.5 - 21.315.5 %   Platelets 191 150 - 400 K/uL   Neutrophils Relative % 83 (H) 43 - 77 %   Neutro Abs 4.7 1.7 - 7.7 K/uL   Lymphocytes Relative 10 (L) 12 - 46 %   Lymphs Abs 0.6 (L) 0.7 - 4.0 K/uL   Monocytes Relative 7 3 - 12 %   Monocytes Absolute 0.4 0.1 - 1.0 K/uL   Eosinophils Relative 0 0 - 5 %   Eosinophils Absolute 0.0 0.0 - 0.7 K/uL   Basophils Relative 0 0 - 1 %   Basophils Absolute 0.0 0.0 - 0.1 K/uL  Comprehensive metabolic panel     Status: Abnormal   Collection Time: 12/16/14  7:53 PM  Result Value Ref Range   Sodium 138 135 - 145 mmol/L   Potassium 3.7 3.5 - 5.1 mmol/L   Chloride 106 101 - 111 mmol/L   CO2 25 22 - 32 mmol/L   Glucose, Bld 115 (H) 70 - 99 mg/dL   BUN 15 6 - 20 mg/dL   Creatinine, Ser 0.860.93 0.61 - 1.24 mg/dL   Calcium 8.9 8.9 - 57.810.3 mg/dL   Total Protein 7.1 6.5 - 8.1 g/dL   Albumin 4.1 3.5 - 5.0 g/dL   AST 39 15 - 41 U/L   ALT 42 17 - 63 U/L   Alkaline Phosphatase 60 38 - 126 U/L   Total Bilirubin 0.9 0.3 - 1.2 mg/dL   GFR calc non Af Amer >60 >60 mL/min   GFR calc Af Amer >60 >60 mL/min   Anion gap 7 5 - 15  Lipase, blood     Status: None   Collection Time: 12/16/14  7:53 PM  Result Value Ref Range   Lipase 32 22 - 51 U/L  D-dimer, quantitative     Status: None   Collection Time: 12/16/14  7:53 PM  Result Value Ref Range    D-Dimer, Quant 0.27 0.00 -  0.48 ug/mL-FEU  I-stat troponin, ED     Status: None   Collection Time: 12/16/14  7:58 PM  Result Value Ref Range   Troponin i, poc 0.00 0.00 - 0.08 ng/mL   Comment 3          Urinalysis, Routine w reflex microscopic     Status: None   Collection Time: 12/16/14  8:00 PM  Result Value Ref Range   Color, Urine YELLOW YELLOW   APPearance CLEAR CLEAR   Specific Gravity, Urine 1.009 1.005 - 1.030   pH 6.0 5.0 - 8.0   Glucose, UA NEGATIVE NEGATIVE mg/dL   Hgb urine dipstick NEGATIVE NEGATIVE   Bilirubin Urine NEGATIVE NEGATIVE   Ketones, ur NEGATIVE NEGATIVE mg/dL   Protein, ur NEGATIVE NEGATIVE mg/dL   Urobilinogen, UA 0.2 0.0 - 1.0 mg/dL   Nitrite NEGATIVE NEGATIVE   Leukocytes, UA NEGATIVE NEGATIVE    UA no evidence of UTI  No results found for: HGBA1C  Estimated Creatinine Clearance: 131.2 mL/min (by C-G formula based on Cr of 0.93).  BNP (last 3 results) No results for input(s): PROBNP in the last 8760 hours.  Other results:  I have pearsonaly reviewed this: ECG REPORT  Rate: 97  Rhythm: Sinus tachycardia ST&T Change: No evidence of ischemia QT 417 Filed Weights   12/16/14 1907  Weight: 92.534 kg (204 lb)     Cultures: No results found for: SDES, SPECREQUEST, CULT, REPTSTATUS   Radiological Exams on Admission: Dg Chest 2 View  12/16/2014   CLINICAL DATA:  Painful inspiration for 4 days, initial encounter  EXAM: CHEST  2 VIEW  COMPARISON:  None.  FINDINGS: The heart size and mediastinal contours are within normal limits. Both lungs are clear. The visualized skeletal structures are unremarkable.  IMPRESSION: No active cardiopulmonary disease.   Electronically Signed   By: Alcide Clever M.D.   On: 12/16/2014 19:37   Ct Abdomen Pelvis W Contrast  12/16/2014   CLINICAL DATA:  Bilateral lower chest and upper abdominal pain for 4 days. Worsening today.  EXAM: CT ABDOMEN AND PELVIS WITH CONTRAST  TECHNIQUE: Multidetector CT imaging of the  abdomen and pelvis was performed using the standard protocol following bolus administration of intravenous contrast.  CONTRAST:  OMNIPAQUE IOHEXOL 300 MG/ML SOLN, 50mL OMNIPAQUE IOHEXOL 300 MG/ML SOLN  COMPARISON:  Lower chest: No significant abnormality. Minimal atelectatic appearing linear dependent base opacities bilaterally.  Hepatobiliary: There are normal appearances of the liver, gallbladder and bile ducts.  Pancreas: Normal  Spleen: Normal  Adrenals/Urinary Tract: The adrenals and kidneys are normal in appearance. There is no urinary calculus evident. There is no hydronephrosis or ureteral dilatation. Collecting systems and ureters appear unremarkable.  Stomach/Bowel: There are normal appearances of the stomach, small bowel and colon. The appendix is normal.  Vascular/Lymphatic: The abdominal aorta is normal in caliber. There is no atherosclerotic calcification. There is no adenopathy in the retroperitoneum. There is a prominent number of physiologic -sized nodes in the mesentery, nonspecific.  Reproductive: Unremarkable  Other: No acute inflammatory changes are evident in the abdomen or pelvis. There is no ascites.  Musculoskeletal: There is a tiny fat containing umbilical hernia. No significant musculoskeletal lesions are evident.  FINDINGS: Minimally prominent mesenteric nodes, nonspecific. Otherwise unremarkable.   Electronically Signed   By: Ellery Plunk M.D.   On: 12/16/2014 22:17   Dg Abd 2 Views  12/16/2014   CLINICAL DATA:  Mid abdominal pain radiating bilaterally for the past 4 days. Fever.  EXAM:  ABDOMEN - 2 VIEW  COMPARISON:  None.  FINDINGS: The bowel gas pattern is normal. There is no evidence of free air. No radio-opaque calculi or other significant radiographic abnormality is seen.  IMPRESSION: Normal examination.   Electronically Signed   By: Beckie SaltsSteven  Reid M.D.   On: 12/16/2014 20:24    Chart has been reviewed  Family  at  Bedside  plan of care was discussed with Father  [phone] Eric Davis  Assessment/Plan  34 year old male with no significant spread medical history presents with severe epigastric/lower chest pain worse with deep breathing or any motion of the torso including raising up his arms. CT scan of abdomen was unremarkable. Chest x-ray showing no evidence of pneumonia. Patient reports fever at home up to 102 but afebrile in emergency department with normal white blood cell count. Rapid flu swab in the office was unremarkable. Being admitted for severe abdominal pain of unclear etiology and fever     Present on Admission:  . Abdominal pain - etiology uncertain. Reassuring negative CT scan. LFTs within normal limits. Lipase normal we'll order CK and lactic acid  No evidence of biliary disease on CAT scan. Repeat C mat in the morning if there is any change could obtain ultrasound of abdomen to evaluate gallbladder better. No evidence of hydronephrosis or nephrolithiasis. UA is unremarkable.  . Febrile illness - Given fevers obtain blood cultures. Suspect viral illness. We will observe give IV fluids and supportive treatment. At this point hold off on antibiotics no source patient is currently afebrile. No evidence of sepsis  . Chest pain - atypical in this prior healthy 34 year old male. Given severity cycle cardiac enzymes. Patient has a negative d-dimer but at reported pleuritic severe chest pain and some shortness of breath. Given patient already had contrast for his CT of the abdomen. Will wait until a.m. to obtain CT angiogram to rule out PE. This will also help evaluate for any developing pneumonia. EKG not consistent with pericarditis. Other possibility could include severe myalgias in the setting of viral illness or musculoskeletal pain   Prophylaxis: SCD    CODE STATUS:  FULL CODE  as per patient    Disposition:  To home once workup is complete and patient is stable  Other plan as per orders.  I have spent a total of 55 min on this  admission  Alandis Bluemel 12/16/2014, 11:01 PM  [facility]  Pager [phone]   after 2 AM please page floor coverage PA If 7AM-7PM, please contact the day team taking care of the patient  [email or web address]  Password [email or web address]

## 2014-12-16 NOTE — ED Provider Notes (Signed)
CSN: [id number]     Arrival date & time 12/16/14  1855 History   First MD Initiated Contact with Patient 12/16/14 1929     Chief Complaint  Patient presents with  . Fever  . Chest Pain     (Consider location/radiation/quality/duration/timing/severity/associated sxs/prior Treatment) Patient is a 34 y.o. male presenting with fever and chest pain. The history is provided by the patient (the pt complains of a fever, chest and abd pain).  Fever Temp source:  Subjective Severity:  Mild Onset quality:  Sudden Timing:  Intermittent Progression:  Unchanged Chronicity:  New Relieved by:  Nothing Associated symptoms: chest pain   Associated symptoms: no congestion, no cough, no diarrhea, no headaches and no rash   Chest Pain Associated symptoms: abdominal pain and fever   Associated symptoms: no back pain, no cough, no fatigue and no headache     History reviewed. No pertinent past medical history. Past Surgical History  Procedure Laterality Date  . Tonsillectomy     Family History  Problem Relation Age of Onset  . Lung disease Maternal Grandfather    History  Substance Use Topics  . Smoking status: Never Smoker   . Smokeless tobacco: Never Used  . Alcohol Use: Yes     Comment: rarely    Review of Systems  Constitutional: Positive for fever. Negative for appetite change and fatigue.  HENT: Negative for congestion, ear discharge and sinus pressure.   Eyes: Negative for discharge.  Respiratory: Negative for cough.   Cardiovascular: Positive for chest pain.  Gastrointestinal: Positive for abdominal pain. Negative for diarrhea.  Genitourinary: Negative for frequency and hematuria.  Musculoskeletal: Negative for back pain.  Skin: Negative for rash.  Neurological: Negative for seizures and headaches.  Psychiatric/Behavioral: Negative for hallucinations.      Allergies  Review of patient's allergies indicates no known allergies.  Home Medications   Prior to Admission  medications   Medication Sig Start Date End Date Taking? Authorizing Provider  azithromycin (ZITHROMAX) 250 MG tablet Take 2 tablets x 1 day, then 1 tab daily for 4 days 12/16/14  Yes Salley ScarletKawanta F , MD   BP 108/58 mmHg  Pulse 81  Temp(Src) 99.3 F (37.4 C) (Oral)  Resp 18  Ht 6\' 3"  (1.905 m)  Wt 204 lb (92.534 kg)  BMI 25.50 kg/m2  SpO2 96% Physical Exam  Constitutional: He is oriented to person, place, and time. He appears well-developed.  HENT:  Head: Normocephalic.  Eyes: Conjunctivae and EOM are normal. No scleral icterus.  Neck: Neck supple. No thyromegaly present.  Cardiovascular: Normal rate and regular rhythm.  Exam reveals no gallop and no friction rub.   No murmur heard. Pulmonary/Chest: No stridor. He has no wheezes. He has no rales. He exhibits no tenderness.  Abdominal: He exhibits no distension. There is tenderness. There is no rebound.  Musculoskeletal: Normal range of motion. He exhibits no edema.  Lymphadenopathy:    He has no cervical adenopathy.  Neurological: He is oriented to person, place, and time. He exhibits normal muscle tone. Coordination normal.  Skin: No rash noted. No erythema.  Psychiatric: He has a normal mood and affect. His behavior is normal.    ED Course  Procedures (including critical care time) Labs Review Labs Reviewed  CBC WITH DIFFERENTIAL/PLATELET - Abnormal; Notable for the following:    Neutrophils Relative % 83 (*)    Lymphocytes Relative 10 (*)    Lymphs Abs 0.6 (*)    All other components within normal limits  COMPREHENSIVE METABOLIC PANEL - Abnormal; Notable for the following:    Glucose, Bld 115 (*)    All other components within normal limits  LIPASE, BLOOD  URINALYSIS, ROUTINE W REFLEX MICROSCOPIC  D-DIMER, QUANTITATIVE  I-STAT TROPOININ, ED    Imaging Review Dg Chest 2 View  12/16/2014   CLINICAL DATA:  Painful inspiration for 4 days, initial encounter  EXAM: CHEST  2 VIEW  COMPARISON:  None.  FINDINGS: The heart  size and mediastinal contours are within normal limits. Both lungs are clear. The visualized skeletal structures are unremarkable.  IMPRESSION: No active cardiopulmonary disease.   Electronically Signed   By: Alcide CleverMark  Lukens M.D.   On: 12/16/2014 19:37   Ct Abdomen Pelvis W Contrast  12/16/2014   CLINICAL DATA:  Bilateral lower chest and upper abdominal pain for 4 days. Worsening today.  EXAM: CT ABDOMEN AND PELVIS WITH CONTRAST  TECHNIQUE: Multidetector CT imaging of the abdomen and pelvis was performed using the standard protocol following bolus administration of intravenous contrast.  CONTRAST:  100mL OMNIPAQUE IOHEXOL 300 MG/ML SOLN, 50mL OMNIPAQUE IOHEXOL 300 MG/ML SOLN  COMPARISON:  Lower chest: No significant abnormality. Minimal atelectatic appearing linear dependent base opacities bilaterally.  Hepatobiliary: There are normal appearances of the liver, gallbladder and bile ducts.  Pancreas: Normal  Spleen: Normal  Adrenals/Urinary Tract: The adrenals and kidneys are normal in appearance. There is no urinary calculus evident. There is no hydronephrosis or ureteral dilatation. Collecting systems and ureters appear unremarkable.  Stomach/Bowel: There are normal appearances of the stomach, small bowel and colon. The appendix is normal.  Vascular/Lymphatic: The abdominal aorta is normal in caliber. There is no atherosclerotic calcification. There is no adenopathy in the retroperitoneum. There is a prominent number of physiologic -sized nodes in the mesentery, nonspecific.  Reproductive: Unremarkable  Other: No acute inflammatory changes are evident in the abdomen or pelvis. There is no ascites.  Musculoskeletal: There is a tiny fat containing umbilical hernia. No significant musculoskeletal lesions are evident.  FINDINGS: Minimally prominent mesenteric nodes, nonspecific. Otherwise unremarkable.   Electronically Signed   By: Ellery Plunkaniel R Mitchell M.D.   On: 12/16/2014 22:17   Dg Abd 2 Views  12/16/2014   CLINICAL  DATA:  Mid abdominal pain radiating bilaterally for the past 4 days. Fever.  EXAM: ABDOMEN - 2 VIEW  COMPARISON:  None.  FINDINGS: The bowel gas pattern is normal. There is no evidence of free air. No radio-opaque calculi or other significant radiographic abnormality is seen.  IMPRESSION: Normal examination.   Electronically Signed   By: Beckie SaltsSteven  Reid M.D.   On: 12/16/2014 20:24     EKG Interpretation   Date/Time:  Friday Dec 16 2014 19:09:14 EDT Ventricular Rate:  95 PR Interval:  235 QRS Duration: 82 QT Interval:  332 QTC Calculation: 417 R Axis:   52 Text Interpretation:  Sinus tachycardia Ventricular premature complex  Prolonged PR interval Left atrial enlargement Confirmed by Lavaris Sexson  MD,  Jomarie LongsJOSEPH [phone]([id number]) on 12/16/2014 10:13:37 PM      MDM   Final diagnoses:  Pain  Abdominal pain in male    Chest and abd pain    Bethann BerkshireJoseph Kinsie Belford, MD 12/16/14 2306

## 2014-12-16 NOTE — ED Notes (Signed)
Bed: WA25 Expected date:  Expected time:  Means of arrival:  Comments: EMS-PNA 

## 2014-12-17 ENCOUNTER — Observation Stay (HOSPITAL_COMMUNITY): Payer: 59

## 2014-12-17 DIAGNOSIS — R079 Chest pain, unspecified: Secondary | ICD-10-CM

## 2014-12-17 HISTORY — DX: Chest pain, unspecified: R07.9

## 2014-12-17 LAB — COMPREHENSIVE METABOLIC PANEL
ALBUMIN: 4.2 g/dL (ref 3.5–5.0)
ALK PHOS: 56 U/L (ref 38–126)
ALT: 42 U/L (ref 17–63)
AST: 35 U/L (ref 15–41)
Anion gap: 6 (ref 5–15)
BILIRUBIN TOTAL: 1 mg/dL (ref 0.3–1.2)
BUN: 12 mg/dL (ref 6–20)
CHLORIDE: 104 mmol/L (ref 101–111)
CO2: 26 mmol/L (ref 22–32)
CREATININE: 0.8 mg/dL (ref 0.61–1.24)
Calcium: 8.8 mg/dL — ABNORMAL LOW (ref 8.9–10.3)
GFR calc Af Amer: >60 mL/min (ref >60)
Glucose, Bld: 136 mg/dL — ABNORMAL HIGH (ref 70–99)
Potassium: 4.3 mmol/L (ref 3.5–5.1)
Sodium: 136 mmol/L (ref 135–145)
Total Protein: 6.9 g/dL (ref 6.5–8.1)

## 2014-12-17 LAB — TSH: TSH: 1.121 u[IU]/mL (ref 0.350–4.500)

## 2014-12-17 LAB — CBC
HEMATOCRIT: 37.7 % — AB (ref 39.0–52.0)
HEMOGLOBIN: 12.8 g/dL — AB (ref 13.0–17.0)
MCH: 28.5 pg (ref 26.0–34.0)
MCHC: 34 g/dL (ref 30.0–36.0)
MCV: 84 fL (ref 78.0–100.0)
Platelets: 182 10*3/uL (ref 150–400)
RBC: 4.49 MIL/uL (ref 4.22–5.81)
RDW: 12.7 % (ref 11.5–15.5)
WBC: 4.5 10*3/uL (ref 4.0–10.5)

## 2014-12-17 LAB — INFLUENZA PANEL BY PCR (TYPE A & B)
H1N1 flu by pcr: NOT DETECTED
Influenza A By PCR: NEGATIVE
Influenza B By PCR: NEGATIVE

## 2014-12-17 LAB — PHOSPHORUS: Phosphorus: 3 mg/dL (ref 2.5–4.6)

## 2014-12-17 LAB — CK: Total CK: 219 U/L (ref 49–397)

## 2014-12-17 LAB — MAGNESIUM: Magnesium: 1.9 mg/dL (ref 1.7–2.4)

## 2014-12-17 LAB — TROPONIN I
Troponin I: <0.03 ng/mL (ref <0.031)
Troponin I: <0.03 ng/mL (ref <0.031)

## 2014-12-17 LAB — LACTIC ACID, PLASMA: Lactic Acid, Venous: 0.6 mmol/L (ref 0.5–2.0)

## 2014-12-17 LAB — PROCALCITONIN: Procalcitonin: <0.1 ng/mL

## 2014-12-17 MED ORDER — HYDROCODONE-ACETAMINOPHEN 5-325 MG PO TABS: 1.0000 | ORAL_TABLET | ORAL | Status: DC | PRN

## 2014-12-17 MED ORDER — OXYCODONE-ACETAMINOPHEN 5-325 MG PO TABS
1.0000 | ORAL_TABLET | ORAL | Status: DC | PRN
  Filled 2014-12-17: qty 1

## 2014-12-17 MED ORDER — CYCLOBENZAPRINE HCL 7.5 MG PO TABS: 7.5000 mg | ORAL_TABLET | Freq: Three times a day (TID) | ORAL | Status: DC | PRN

## 2014-12-17 MED ORDER — OXYCODONE-ACETAMINOPHEN 5-325 MG PO TABS: 1.0000 | ORAL_TABLET | ORAL | Status: DC | PRN

## 2014-12-17 MED ORDER — ACETAMINOPHEN 650 MG RE SUPP
650.0000 mg | Freq: Four times a day (QID) | RECTAL | Status: DC | PRN
Start: 2014-12-17 — End: 2014-12-17

## 2014-12-17 MED ORDER — ONDANSETRON HCL 4 MG/2ML IJ SOLN: 4.0000 mg | Freq: Four times a day (QID) | INTRAMUSCULAR | Status: DC | PRN

## 2014-12-17 MED ORDER — ACETAMINOPHEN 325 MG PO TABS
650.0000 mg | ORAL_TABLET | Freq: Four times a day (QID) | ORAL | Status: DC | PRN
  Administered 2014-12-17 (×2): 650 mg via ORAL
  Filled 2014-12-17 (×2): qty 2

## 2014-12-17 MED ORDER — ALBUTEROL SULFATE (2.5 MG/3ML) 0.083% IN NEBU: 2.5000 mg | INHALATION_SOLUTION | RESPIRATORY_TRACT | Status: DC | PRN

## 2014-12-17 MED ORDER — CYCLOBENZAPRINE HCL 5 MG PO TABS
7.5000 mg | ORAL_TABLET | Freq: Three times a day (TID) | ORAL | Status: DC | PRN
  Administered 2014-12-17: 7.5 mg via ORAL
  Filled 2014-12-17: qty 2

## 2014-12-17 MED ORDER — SODIUM CHLORIDE 0.9 % IJ SOLN: 3.0000 mL | Freq: Two times a day (BID) | INTRAMUSCULAR | Status: DC

## 2014-12-17 MED ORDER — ONDANSETRON HCL 4 MG PO TABS: 4.0000 mg | ORAL_TABLET | Freq: Four times a day (QID) | ORAL | Status: DC | PRN

## 2014-12-17 MED ORDER — SODIUM CHLORIDE 0.9 % IV SOLN
INTRAVENOUS | Status: DC
  Administered 2014-12-17: 02:00:00 via INTRAVENOUS

## 2014-12-17 NOTE — Progress Notes (Signed)
  Echocardiogram 2D Echocardiogram has been performed.  Aris EvertsRix, Cherell Colvin A 12/17/2014, 12:02 PM

## 2014-12-17 NOTE — Discharge Instructions (Signed)
Follow with [facility] TOM, MD in 5-7 days  Please get a complete blood count and chemistry panel checked by your Primary MD at your next visit, and again as instructed by your Primary MD. Please get your medications reviewed and adjusted by your Primary MD.  Please request your Primary MD to go over all Hospital Tests and Procedure/Radiological results at the follow up, please get all Hospital records sent to your Prim MD by signing hospital release before you go home.  If you had Pneumonia of Lung problems at the Hospital: Please get a 2 view Chest X ray done in 6-8 weeks after hospital discharge or sooner if instructed by your Primary MD.  If you have Congestive Heart Failure: Please call your Cardiologist or Primary MD anytime you have any of the following symptoms:  1) 3 pound weight gain in 24 hours or 5 pounds in 1 week  2) shortness of breath, with or without a dry hacking cough  3) swelling in the hands, feet or stomach  4) if you have to sleep on extra pillows at night in order to breathe  Follow cardiac low salt diet and 1.5 lit/day fluid restriction.  If you have diabetes Accuchecks 4 times/day, Once in AM empty stomach and then before each meal. Log in all results and show them to your primary doctor at your next visit. If any glucose reading is under 80 or above 300 call your primary MD immediately.  If you have Seizure/Convulsions/Epilepsy: Please do not drive, operate heavy machinery, participate in activities at heights or participate in high speed sports until you have seen by Primary MD or a Neurologist and advised to do so again.  If you had Gastrointestinal Bleeding: Please ask your Primary MD to check a complete blood count within one week of discharge or at your next visit. Your endoscopic/colonoscopic biopsies that are pending at the time of discharge, will also need to followed by your Primary MD.  Get Medicines reviewed and adjusted. Please take all your  medications with you for your next visit with your Primary MD  Please request your Primary MD to go over all hospital tests and procedure/radiological results at the follow up, please ask your Primary MD to get all Hospital records sent to his/her office.  If you experience worsening of your admission symptoms, develop shortness of breath, life threatening emergency, suicidal or homicidal thoughts you must seek medical attention immediately by calling 911 or calling your MD immediately  if symptoms less severe.  You must read complete instructions/literature along with all the possible adverse reactions/side effects for all the Medicines you take and that have been prescribed to you. Take any new Medicines after you have completely understood and accpet all the possible adverse reactions/side effects.   Do not drive or operate heavy machinery when taking Pain medications.   Do not take more than prescribed Pain, Sleep and Anxiety Medications  Special Instructions: If you have smoked or chewed Tobacco  in the last 2 yrs please stop smoking, stop any regular Alcohol  and or any Recreational drug use.  Wear Seat belts while driving.  Please note You were cared for by a hospitalist during your hospital stay. If you have any questions about your discharge medications or the care you received while you were in the hospital after you are discharged, you can call the unit and asked to speak with the hospitalist on call if the hospitalist that took care of you is not available. Once  you are discharged, your primary care physician will handle any further medical issues. Please note that NO REFILLS for any discharge medications will be authorized once you are discharged, as it is imperative that you return to your primary care physician (or establish a relationship with a primary care physician if you do not have one) for your aftercare needs so that they can reassess your need for medications and monitor your  lab values.  You can reach the hospitalist office at phone [phone] or fax [phone]   If you do not have a primary care physician, you can call [phone] for a physician referral.  Activity: As tolerated with Full fall precautions use walker/cane & assistance as needed  Diet: regular  Disposition Home

## 2014-12-17 NOTE — Progress Notes (Signed)
Discharge instructions and medications reviewed with patient. Patient has no questions at this time. Patient in no distress at this time. Patient confirms he has all personal belongings in is possession. Patient discharged home.

## 2014-12-17 NOTE — Progress Notes (Signed)
Pt arrived to unit room 1505 via stretcher. VS taken pt oriented to room and callbell with no complications. Gait unsteady and general weakness pain 5/10. Initial assessment completed, pt guide at bedside. Will continue to monitor throughout shift.

## 2014-12-17 NOTE — Progress Notes (Signed)
Utilization Review completed.  

## 2014-12-18 DIAGNOSIS — R1084 Generalized abdominal pain: Secondary | ICD-10-CM

## 2014-12-18 DIAGNOSIS — R0782 Intercostal pain: Secondary | ICD-10-CM

## 2014-12-18 NOTE — Discharge Summary (Signed)
Physician Discharge Summary  Eric Furlongndrew S Dunn ZOX:[id number] DOB: 1981-04-21 DOA: 12/16/2014  PCP: Leo GrosserPICKARD,WARREN TOM, MD  Admit date: 12/16/2014 Discharge date: 12/18/2014  Time spent: > 30 minutes  Recommendations for Outpatient Follow-up:  1. Follow up with PCP in 1-2 weeks   Discharge Diagnoses:  Active Problems:   Abdominal pain   Febrile illness   Chest pain  Discharge Condition: stable  Diet recommendation: regular  Filed Weights   12/16/14 1907 12/17/14 0120  Weight: 92.534 kg (204 lb) 93.033 kg (205 lb 1.6 oz)   History of present illness:  Patient reports 3 day history of lower chest pain/epigastric abdominal pain worse with deep breathing or raising his arms up. Patient self reports Fevers up to 102 and sore throat. Patient has been exposed to his 36-year-old which were ill early on. Denies any cough. Presented to his primary care provider office and was tested for strep and flu which were both negative. He was given a Z-Pak and asked to follow-up for chest x-ray on Monday. Later on today patient called EMS. Temperature was up to 101.7. Chest x-ray as well as a CT scan of abdomen has been unremarkable except for mild mesenteric lymphadenopathy. D-dimer has been negative lipase normal. Troponin within normal limits EKG showing no evidence of ST the patient's. Hospitalist was called for admission for chest and abdominal pain  Hospital Course:  Patient was admitted to the hospital with acute abdominal as well as generalized chest pain in the setting of possible viral illness. Given severity of the pain, he underwent a CT scan of the abdomen and pelvis which was fairly unremarkable. His workup was essentially normal, and this further included normal comprehensive metabolic panel, normal CBC, negative d-dimer, negative cardiac enzymes, normal CK, with an unremarkable lactic acid as well as procalcitonin levels. His influenza PCR was negative. He also underwent a 2-D echo to evaluate for  pericarditis, and this test was normal with normal ejection fraction without any wall motion abnormalities. Patient's chest pain seems to be generalized, worse with movement, he was started on a muscle relaxant as well as pain regimen which has improved his pain. He was discharged home in stable condition and was instructed to follow-up with his PCP in 1-2 weeks.  Procedures:  2D echo  Study Conclusions - Left ventricle: The cavity size was normal. There was mildconcentric hypertrophy. Systolic function was normal. Theestimated ejection fraction was in the range of 55% to 60%. Wallmotion was normal; there were no regional wall motionabnormalities. - Left atrium: The atrium was mildly dilated.  Consultations:  None   Discharge Exam: Filed Vitals:   12/17/14 0120 12/17/14 0532 12/17/14 0635 12/17/14 1613  BP: 124/64 119/46 100/70 104/60  Pulse: 79 68  63  Temp: 98.2 F (36.8 C) 98.4 F (36.9 C)  99 F (37.2 C)  TempSrc: Oral Oral  Oral  Resp: 18 18  18   Height: 6\' 3"  (1.905 m)     Weight: 93.033 kg (205 lb 1.6 oz)     SpO2: 100% 99%  93%    General: NAD Cardiovascular: RRR Respiratory: CTA biL  Discharge Instructions     Medication List    STOP taking these medications        azithromycin 250 MG tablet  Commonly known as:  ZITHROMAX      TAKE these medications        cyclobenzaprine 7.5 MG tablet  Commonly known as:  FEXMID  Take 1 tablet (7.5 mg total) by mouth  3 (three) times daily as needed for muscle spasms.     oxyCODONE-acetaminophen 5-325 MG per tablet  Commonly known as:  PERCOCET/ROXICET  Take 1-2 tablets by mouth every 4 (four) hours as needed for severe pain.           Follow-up Information    [facility] Schedule an appointment as soon as possible for a visit in 1 week.   Specialty:  Family Medicine   Contact information:   [address] [place] [address] [phone]       The results of  significant diagnostics from this hospitalization (including imaging, microbiology, ancillary and laboratory) are listed below for reference.    Significant Diagnostic Studies: Dg Chest 2 View  12/16/2014   CLINICAL DATA:  Painful inspiration for 4 days, initial encounter  EXAM: CHEST  2 VIEW  COMPARISON:  None.  FINDINGS: The heart size and mediastinal contours are within normal limits. Both lungs are clear. The visualized skeletal structures are unremarkable.  IMPRESSION: No active cardiopulmonary disease.   Electronically Signed   By: Alcide CleverMark  Lukens M.D.   On: 12/16/2014 19:37   Ct Abdomen Pelvis W Contrast  12/16/2014   CLINICAL DATA:  Bilateral lower chest and upper abdominal pain for 4 days. Worsening today.  EXAM: CT ABDOMEN AND PELVIS WITH CONTRAST  TECHNIQUE: Multidetector CT imaging of the abdomen and pelvis was performed using the standard protocol following bolus administration of intravenous contrast.  CONTRAST:  100mL OMNIPAQUE IOHEXOL 300 MG/ML SOLN, 50mL OMNIPAQUE IOHEXOL 300 MG/ML SOLN  COMPARISON:  Lower chest: No significant abnormality. Minimal atelectatic appearing linear dependent base opacities bilaterally.  Hepatobiliary: There are normal appearances of the liver, gallbladder and bile ducts.  Pancreas: Normal  Spleen: Normal  Adrenals/Urinary Tract: The adrenals and kidneys are normal in appearance. There is no urinary calculus evident. There is no hydronephrosis or ureteral dilatation. Collecting systems and ureters appear unremarkable.  Stomach/Bowel: There are normal appearances of the stomach, small bowel and colon. The appendix is normal.  Vascular/Lymphatic: The abdominal aorta is normal in caliber. There is no atherosclerotic calcification. There is no adenopathy in the retroperitoneum. There is a prominent number of physiologic -sized nodes in the mesentery, nonspecific.  Reproductive: Unremarkable  Other: No acute inflammatory changes are evident in the abdomen or pelvis. There is  no ascites.  Musculoskeletal: There is a tiny fat containing umbilical hernia. No significant musculoskeletal lesions are evident.  FINDINGS: Minimally prominent mesenteric nodes, nonspecific. Otherwise unremarkable.   Electronically Signed   By: Ellery Plunkaniel R Mitchell M.D.   On: 12/16/2014 22:17   Dg Abd 2 Views  12/16/2014   CLINICAL DATA:  Mid abdominal pain radiating bilaterally for the past 4 days. Fever.  EXAM: ABDOMEN - 2 VIEW  COMPARISON:  None.  FINDINGS: The bowel gas pattern is normal. There is no evidence of free air. No radio-opaque calculi or other significant radiographic abnormality is seen.  IMPRESSION: Normal examination.   Electronically Signed   By: Beckie SaltsSteven  Reid M.D.   On: 12/16/2014 20:24    Microbiology: Recent Results (from the past 240 hour(s))  Rapid Strep Screen     Status: None   Collection Time: 12/16/14  3:42 PM  Result Value Ref Range Status   Source NASAL  Final   Streptococcus, Group A Screen (Direct) NEG NEGATIVE Final    Comment:   A Rapid Antigen test may result negative if the antigen level in the sample is below  the detection level of this test. The FDA has not cleared this test as a stand-alone test therefore the rapid antigen negative result has reflexed to a Group A Strep culture, unit code 16109.      Influenza a and b     Status: None   Collection Time: 12/16/14  3:42 PM  Result Value Ref Range Status   Source-INFBD THROAT  Final   Inflenza A Ag NEG Negative Final   Influenza B Ag NEG Negative Final     Labs: Basic Metabolic Panel:  Recent Labs Lab 12/16/14 1953 12/17/14 0150  NA 138 136  K 3.7 4.3  CL 106 104  CO2 25 26  GLUCOSE 115* 136*  BUN 15 12  CREATININE 0.93 0.80  CALCIUM 8.9 8.8*  MG  --  1.9  PHOS  --  3.0   Liver Function Tests:  Recent Labs Lab 12/16/14 1953 12/17/14 0150  AST 39 35  ALT 42 42  ALKPHOS 60 56  BILITOT 0.9 1.0  PROT 7.1 6.9  ALBUMIN 4.1 4.2    Recent Labs Lab 12/16/14 1953  LIPASE 32    CBC:  Recent Labs Lab 12/16/14 1953 12/17/14 0150  WBC 5.7 4.5  NEUTROABS 4.7  --   HGB 13.3 12.8*  HCT 39.1 37.7*  MCV 83.9 84.0  PLT 191 182   Cardiac Enzymes:  Recent Labs Lab 12/16/14 2357 12/17/14 0150 12/17/14 0751  CKTOTAL 219  --   --   TROPONINI  --  <0.03 <0.03   Signed:  Pamella Pert  [facility] 12/18/2014, 9:55 AM

## 2014-12-23 ENCOUNTER — Encounter: Payer: Self-pay | Admitting: Family Medicine

## 2015-09-18 ENCOUNTER — Ambulatory Visit (INDEPENDENT_AMBULATORY_CARE_PROVIDER_SITE_OTHER): Payer: 59 | Admitting: Physician Assistant

## 2015-09-18 ENCOUNTER — Encounter: Payer: Self-pay | Admitting: Physician Assistant

## 2015-09-18 VITALS — BP 130/90 | HR 68 | Temp 98.0°F | Resp 18 | Wt 204.0 lb

## 2015-09-18 DIAGNOSIS — H8110 Benign paroxysmal vertigo, unspecified ear: Secondary | ICD-10-CM | POA: Diagnosis not present

## 2015-09-18 MED ORDER — MECLIZINE HCL 25 MG PO TABS: 25.0000 mg | ORAL_TABLET | Freq: Three times a day (TID) | ORAL | Status: DC | PRN

## 2015-09-18 NOTE — Progress Notes (Signed)
Patient ID: DEMICO PLOCH MRN: [id number], DOB: Jun 11, 1981, 35 y.o. Date of Encounter: @  Chief Complaint:  Chief Complaint  Patient presents with  . c/o episodes od dizziness    started occ in Dec now happening more often    HPI: 35 y.o. year old male  presents with above.   Says he had one episode of this in December. Then it went away but has returned recently and was worse this weekend.  Says he if he rolls over in bed, develops vertigo/spinning sensation.  Says any quick motion causes it to occur.  Feels like he just got off a roller coaster.   No recent URI or viral illness.   No weakness in any extremity. No slurred speech. No other neurologic changes or deficits. No headache or blurred vision.    History reviewed. No pertinent past medical history.   Home Meds: Outpatient Prescriptions Prior to Visit  Medication Sig Dispense Refill  . cyclobenzaprine (FEXMID) 7.5 MG tablet Take 1 tablet (7.5 mg total) by mouth 3 (three) times daily as needed for muscle spasms. (Patient not taking: Reported on 09/18/2015) 30 tablet 0  . oxyCODONE-acetaminophen (PERCOCET/ROXICET) 5-325 MG per tablet Take 1-2 tablets by mouth every 4 (four) hours as needed for severe pain. (Patient not taking: Reported on 09/18/2015) 30 tablet 0  . pantoprazole (PROTONIX) 40 MG tablet Take 1 tablet (40 mg total) by mouth daily. (Patient not taking: Reported on 09/18/2015) 30 tablet 3  . promethazine (PHENERGAN) 25 MG tablet Take 1 tablet (25 mg total) by mouth every 6 (six) hours as needed for nausea or vomiting. (Patient not taking: Reported on 09/18/2015) 20 tablet 0   No facility-administered medications prior to visit.    Allergies: No Known Allergies  Social History   Social History  . Marital Status: Married    Spouse Name: N/A  . Number of Children: N/A  . Years of Education: N/A   Occupational History  . Not on file.   Social History Main Topics  . Smoking status: Never Smoker   .  Smokeless tobacco: Never Used  . Alcohol Use: Yes     Comment: rarely  . Drug Use: No  . Sexual Activity: Not on file   Other Topics Concern  . Not on file   Social History Narrative   ** Merged History Encounter **        Family History  Problem Relation Age of Onset  . Lung disease Maternal Grandfather      Review of Systems:  See HPI for pertinent ROS. All other ROS negative.    Physical Exam: Blood pressure 130/90, pulse 68, temperature 98 F (36.7 C), temperature source Oral, resp. rate 18, weight 204 lb (92.534 kg)., Body mass index is 25.5 kg/(m^2). General: WNWD WM. Appears in no acute distress. Head: Normocephalic, atraumatic, eyes without discharge, sclera non-icteric, nares are without discharge. Bilateral auditory canals clear, TM's are without perforation, pearly grey and translucent with reflective cone of light bilaterally. Oral cavity moist, posterior pharynx without exudate, erythema, peritonsillar abscess. [place] Hall[place]ike Maneuver: This reproduces symptoms.   Neck: Supple. No thyromegaly. No lymphadenopathy.No carotid bruits.  Lungs: Clear bilaterally to auscultation without wheezes, rales, or rhonchi. Breathing is unlabored. Heart: RRR with S1 S2. No murmurs, rubs, or gallops. Musculoskeletal:  Strength and tone normal for age. Extremities/Skin: Warm and dry. Neuro: Alert and oriented X 3. Moves all extremities spontaneously. Gait is normal. CNII-XII grossly in tact. Romberg normal.  Psych:  Responds to questions appropriately with a normal affect.     ASSESSMENT AND PLAN:  35 y.o. year old male with  1. Benign paroxysmal positional vertigo, unspecified laterality He is to take Meclizine PRN. If symptoms not resolved in one week, he is to call and will refer to ENT for Epley Maneuvers. - meclizine (ANTIVERT) 25 MG tablet; Take 1 tablet (25 mg total) by mouth 3 (three) times daily as needed for dizziness.  Dispense: 30 tablet; Refill: 0   Signed, [address]  [place], [place], [facility] 09/18/2015 7:40 PM

## 2016-01-09 ENCOUNTER — Ambulatory Visit (INDEPENDENT_AMBULATORY_CARE_PROVIDER_SITE_OTHER): Payer: 59 | Admitting: Podiatry

## 2016-01-09 ENCOUNTER — Ambulatory Visit (INDEPENDENT_AMBULATORY_CARE_PROVIDER_SITE_OTHER): Payer: 59

## 2016-01-09 ENCOUNTER — Encounter: Payer: Self-pay | Admitting: Podiatry

## 2016-01-09 VITALS — BP 102/88 | HR 64 | Resp 12

## 2016-01-09 DIAGNOSIS — S91209A Unspecified open wound of unspecified toe(s) with damage to nail, initial encounter: Secondary | ICD-10-CM | POA: Diagnosis not present

## 2016-01-09 DIAGNOSIS — R52 Pain, unspecified: Secondary | ICD-10-CM

## 2016-01-09 DIAGNOSIS — L03011 Cellulitis of right finger: Secondary | ICD-10-CM

## 2016-01-09 MED ORDER — AMOXICILLIN-POT CLAVULANATE 875-125 MG PO TABS: 1.0000 | ORAL_TABLET | Freq: Two times a day (BID) | ORAL | Status: DC

## 2016-01-09 NOTE — Progress Notes (Signed)
   Subjective:    Patient ID: Eric Davis, male    DOB: [birth year], 35 y.o.   MRN: [id number]  HPI   Patient presents today complaining of increasing pain in the distal right hallux. He describes slipping on a wood ladder4 days ago, 01/05/2016, when he was climbing [place] of [place] from [place]. He said he felt the end of the toenail lifting  from the nailbed. Since the injury the pain has been increasing a describes pressing on the nail yesterday, releasing a thick drainage from the toenail area  Review of Systems  Skin: Positive for color change.       Objective:   Physical Exam  Orientated 3  Vascular: DP and PT pulses 2/4 bilaterally Capillary reflex immediate bilaterally  Neurological: Sensation to 10 g monofilament wire intact 5/5 bilaterally Ankle reflexes equal and reactive bilaterally Vibratory sensation equal reactive bilaterally  Dermatological: The distal right hallux nailbed is lifting off the nailbed with a darkened area at the distal aspect of the nail plate. There is local erythema and edema extends to the interphalangeal joint of the right hallux. The distal right hallux is exquisitely tender to direct pressure  X-ray examination right foot dated 01/09/2016  Intact bony structures without fracture and/or dislocation Circular well-defined increased density in the distal phalanx all 3 views  Radiographic impression: No acute bony abnormality noted in the right hallux    Assessment & Plan:   Assessment: Partial traumatic nail avulsion right hallux Paronychia right hallux  Plan: Reviewed the results of exam an x-ray today and recommended avulsion of the nail plate. Patient verbally consents  Right hallux was blocked with 3 mL of 50-50 mixture of 2% plain Xylocaine and 0.5% plain Sensorcaine. The right hallux was prepped with Betadine exsanguinated. The hallux nail plate was a false demonstrating a 5 mm area of inflammatory tissue at the very distal aspect  of the right hallux. There is no active drainage, malodor or warmth noted. An antibiotic compression dressing was applied. The tourniquet was released and spontaneous capillary fill time noted in the right hallux. Postoperative Betadine soaks prescribed daily with application of topical antibiotic ointment to the nailbed until healed Augmentin 875/125 dispensed 27 1 by mouth twice a day Surgical shoe dispensed to wear on right foot  Reappoint at patient's request

## 2016-01-09 NOTE — Patient Instructions (Addendum)
Betadine Soak Instructions  Purchase an 8 oz. bottle of BETADINE solution (Povidone)  THE DAY AFTER THE PROCEDURE  Place 1 tablespoon of betadine solution in a quart of warm tap water.  Submerge your foot or feet with outer bandage intact for the initial soak; this will allow the bandage to become moist and wet for easy lift off.  Once you remove your bandage, continue to soak in the solution for 2 minutes.  This soak should be done twice a day.  Next, remove your foot or feet from solution, blot dry the affected area and cover.  You may use a band aid large enough to cover the area or use gauze and tape.  Apply other medications to the area as such as Neosporin, triple antibiotic ointment. Begin taking her antibiotics by mouth one twice a day 10 days Wear the surgical shoe Do not go into [facility] water until a scab forms  IF YOUR SKIN BECOMES IRRITATED WHILE USING THESE INSTRUCTIONS, IT IS OKAY TO SWITCH TO EPSOM SALTS AND WATER OR WHITE VINEGAR AND WATER.

## 2016-06-11 IMAGING — CR DG ABDOMEN 2V
2 series · 2 of 2 positions shown · non-contrast
Comparison: None.

CLINICAL DATA: Mid abdominal pain radiating bilaterally for the
past 4 days. Fever.

EXAM:
ABDOMEN - 2 VIEW

[w abdomen decub]
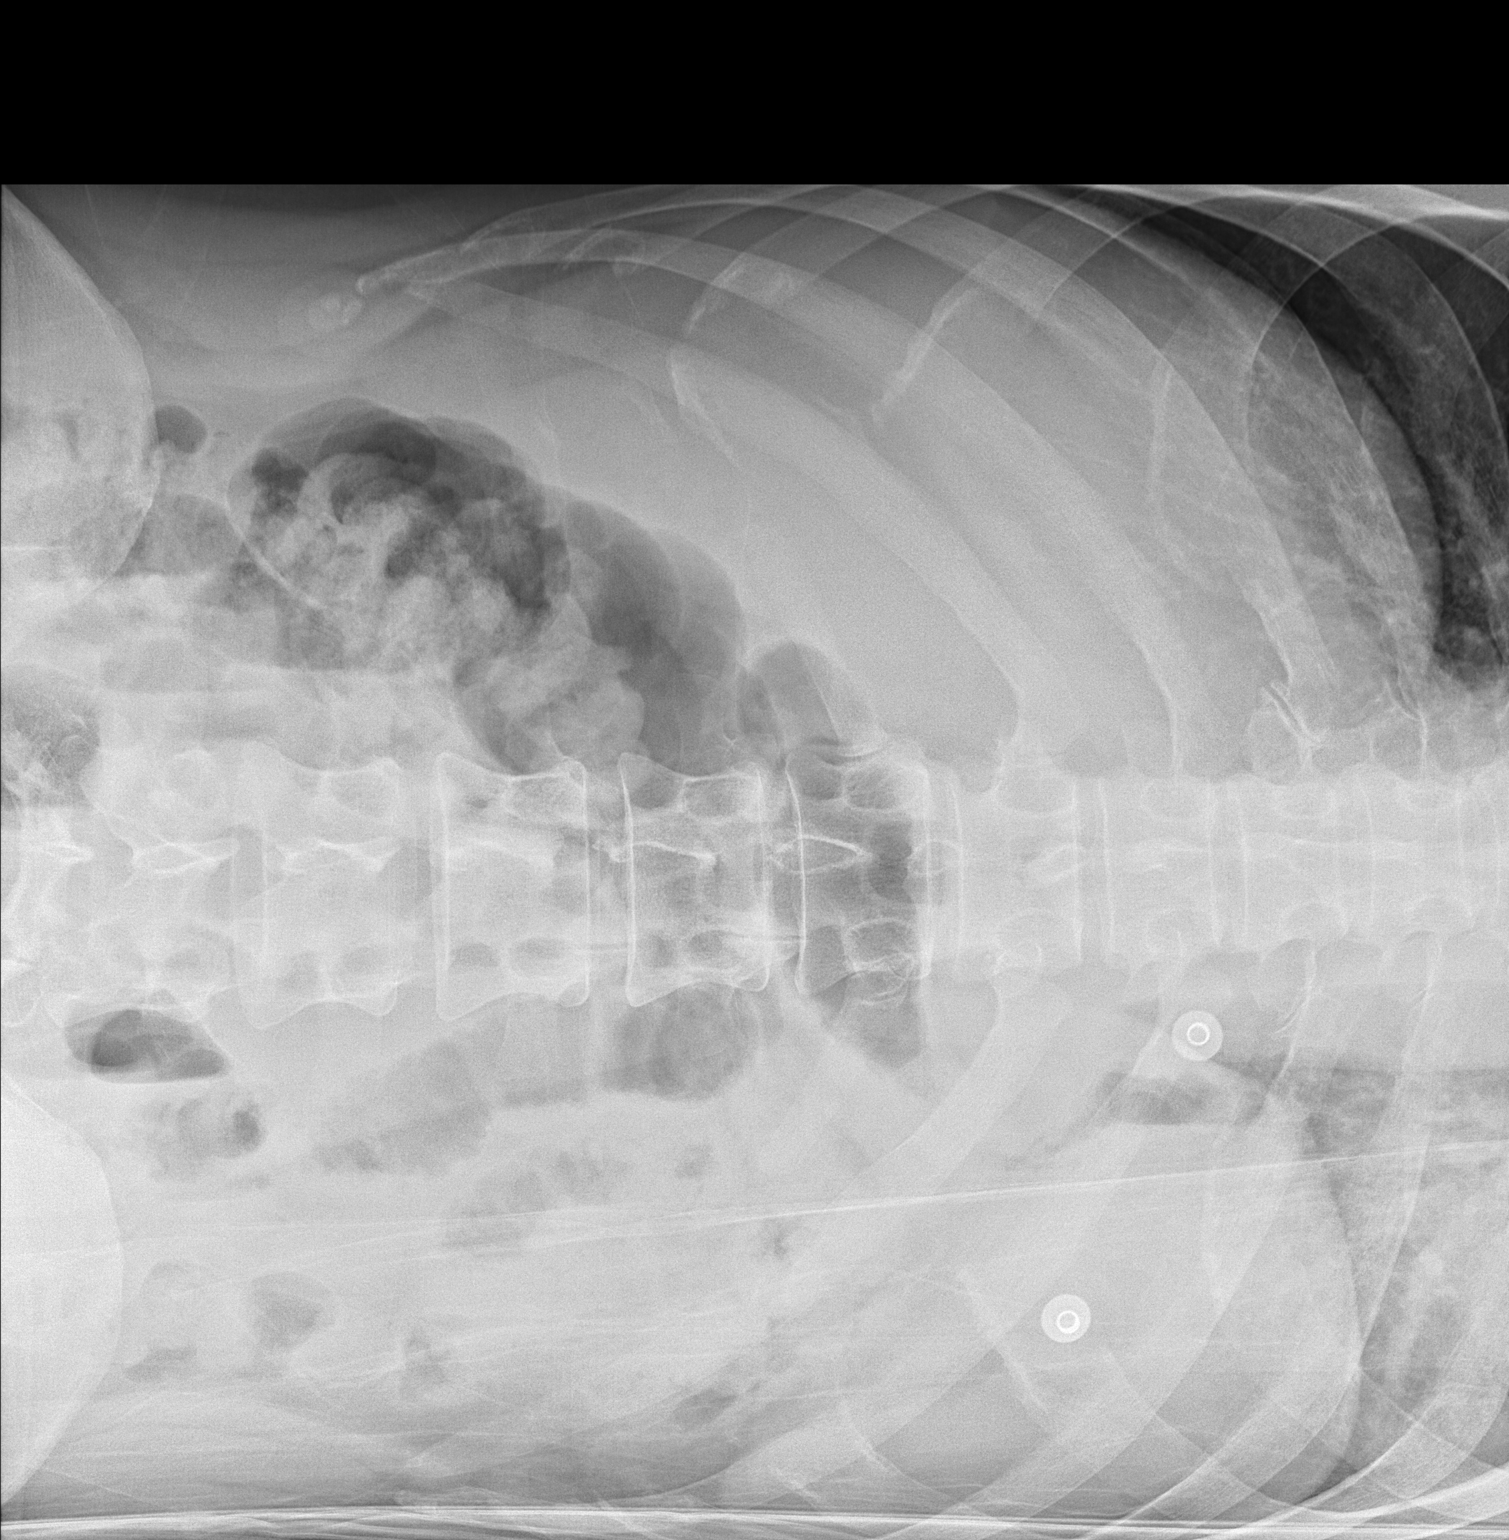

[x abdomen supine]
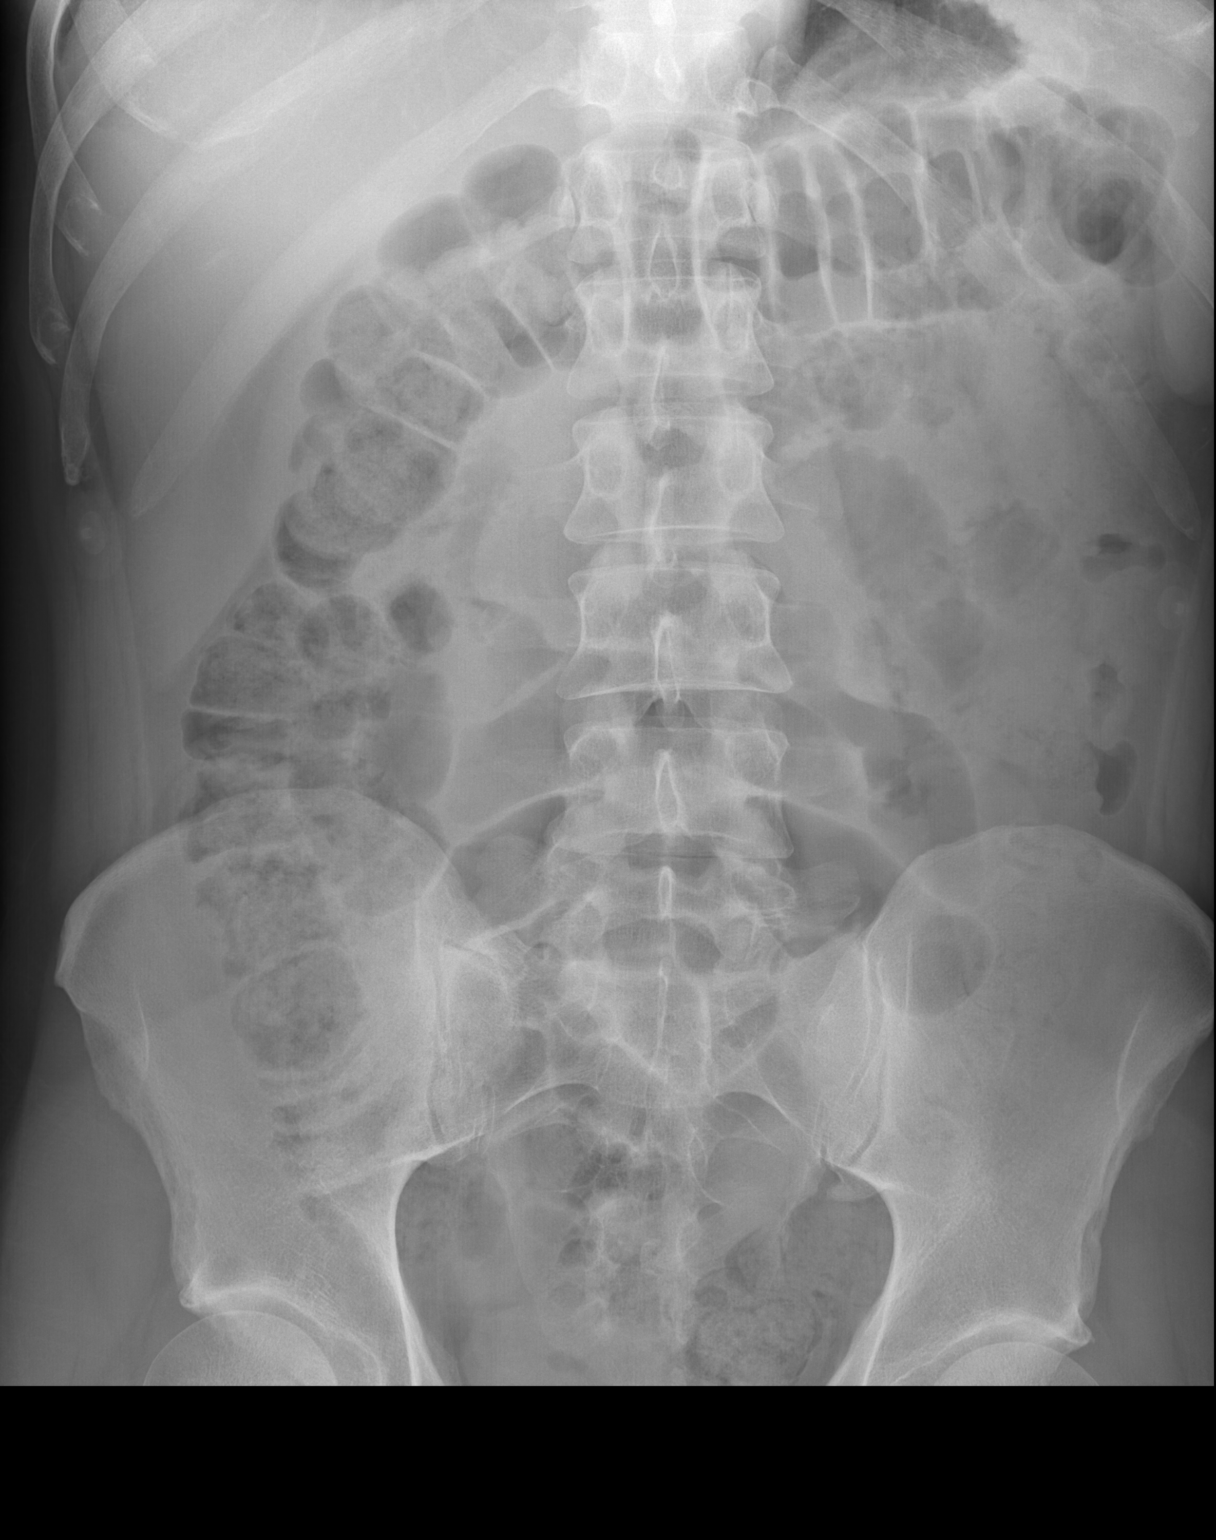

[2 of 2 positions shown; findings below may reference images not displayed]

FINDINGS: The bowel gas pattern is normal. There is no evidence of free air.
No radio-opaque calculi or other significant radiographic
abnormality is seen.
IMPRESSION: Normal examination.

## 2016-06-11 IMAGING — CT CT ABD-PELV W/ CM
2 of 4 series · 16 of 46 positions shown, 18 images · IV contrast (OMNIPAQUE 300)
Comparison: Lower chest: No significant abnormality.

CLINICAL DATA: Bilateral lower chest and upper abdominal pain for 4
days. Worsening today.

EXAM:
CT ABDOMEN AND PELVIS WITH CONTRAST
TECHNIQUE: Multidetector CT imaging of the abdomen and pelvis was performed
using the standard protocol following bolus administration of
intravenous contrast.
CONTRAST:  100mL OMNIPAQUE IOHEXOL 300 MG/ML SOLN, 50mL OMNIPAQUE
IOHEXOL 300 MG/ML SOLN

[Series 2: abd/pel with · axial · 0.84mm/px · z∈[-432,+28]mm · 13 of 100 slices shown, 15 images]
[im 4/100  soft-tissue]
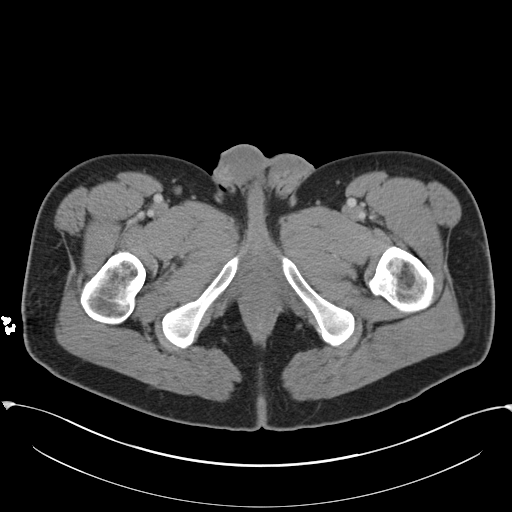
[im 4/100  bone]
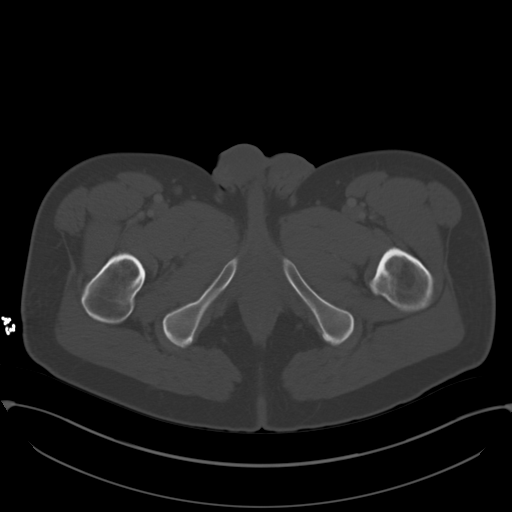
[im 12/100  soft-tissue]
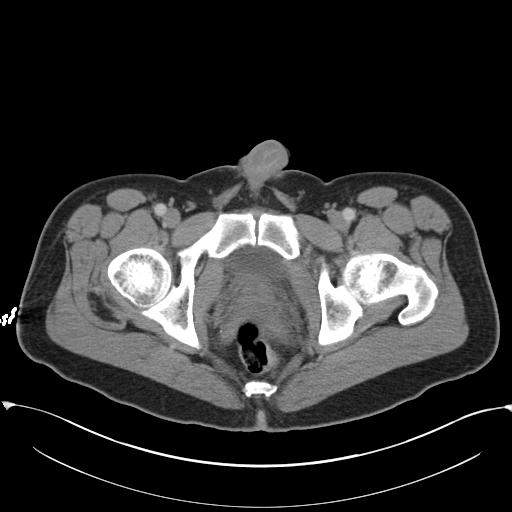
[im 20/100  soft-tissue]
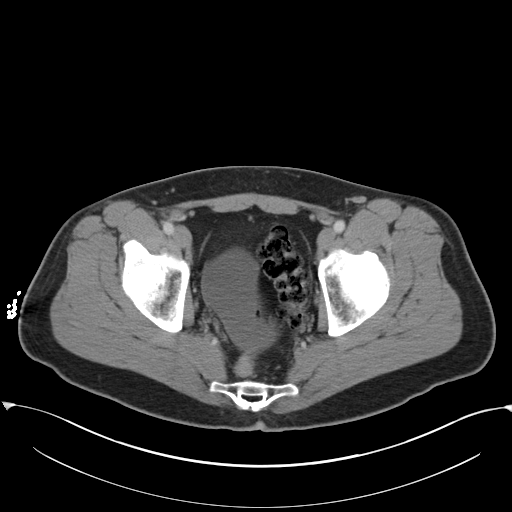
[im 28/100  soft-tissue]
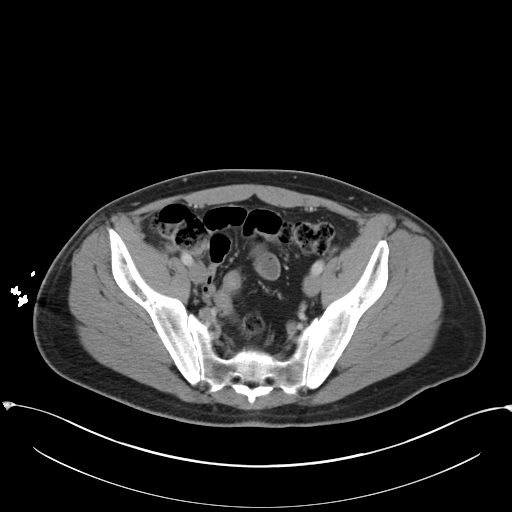
[im 36/100  soft-tissue]
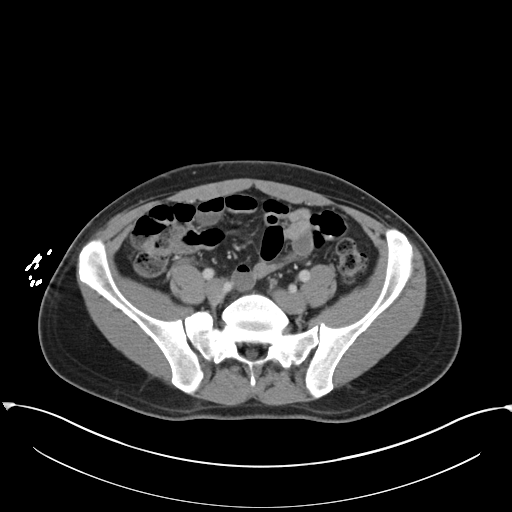
[im 44/100  soft-tissue]
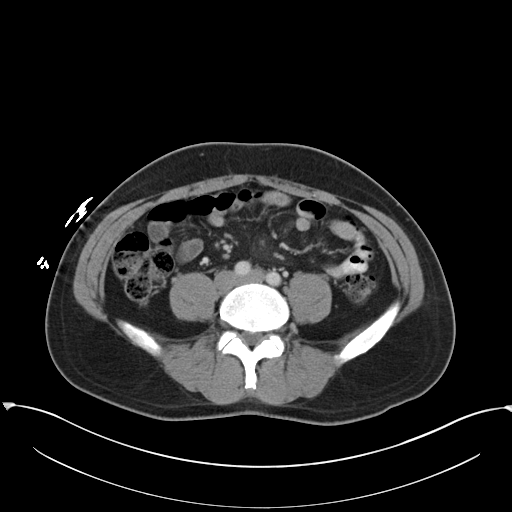
[im 52/100  soft-tissue]
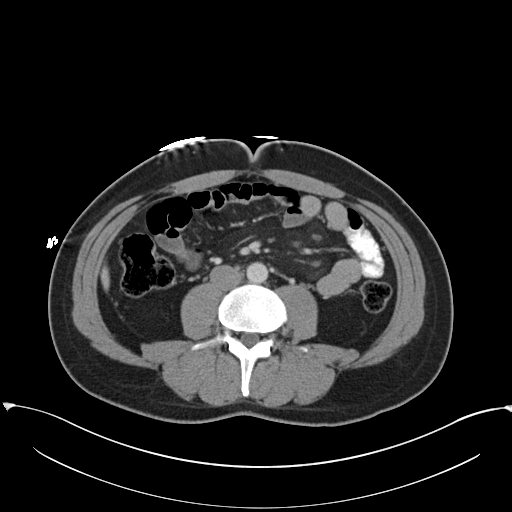
[im 56/100  soft-tissue]
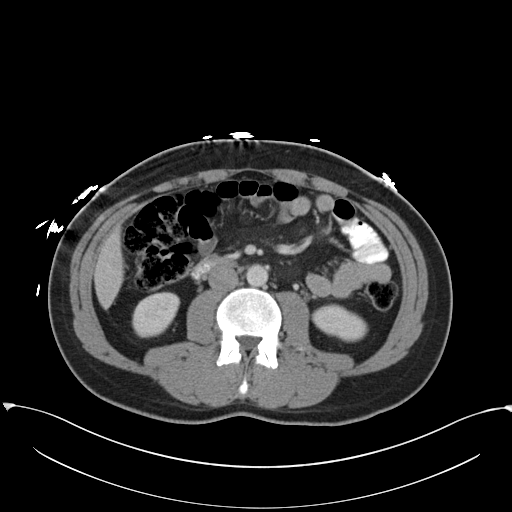
[im 64/100  soft-tissue]
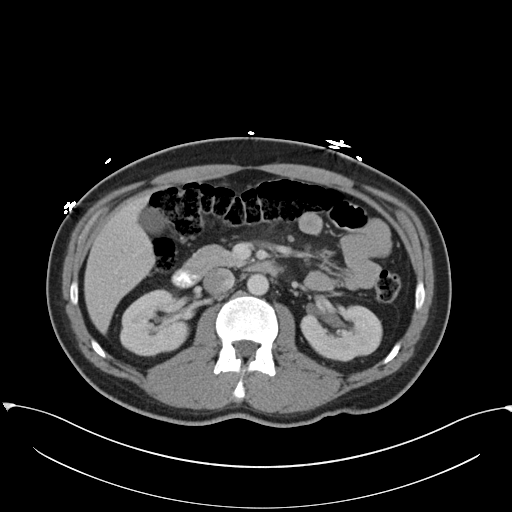
[im 64/100  bone]
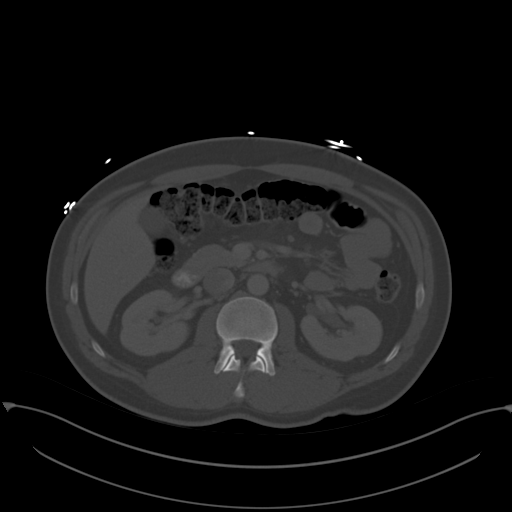
[im 72/100  soft-tissue]
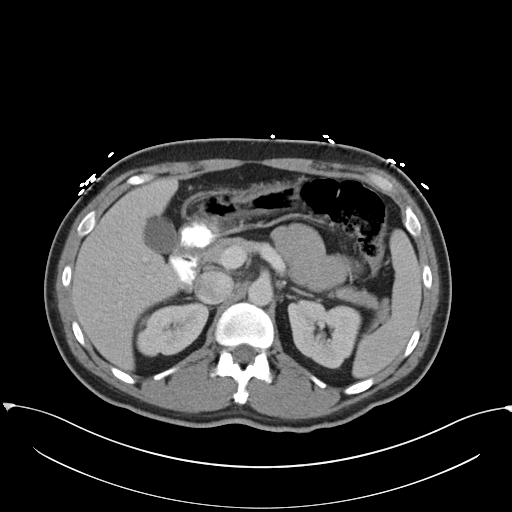
[im 80/100  soft-tissue]
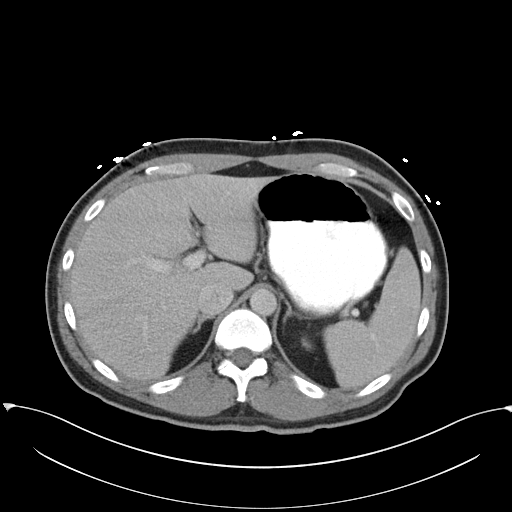
[im 88/100  soft-tissue]
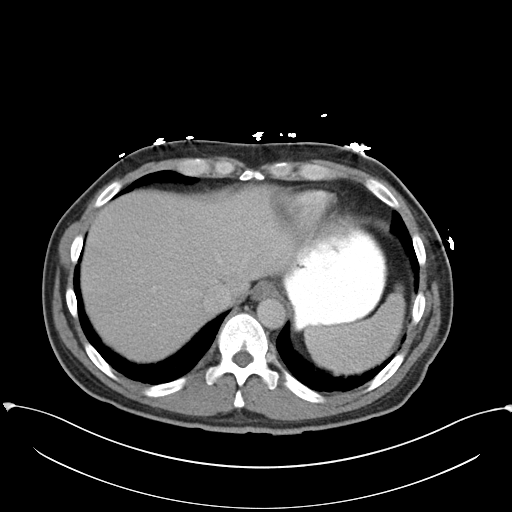
[im 96/100  soft-tissue]
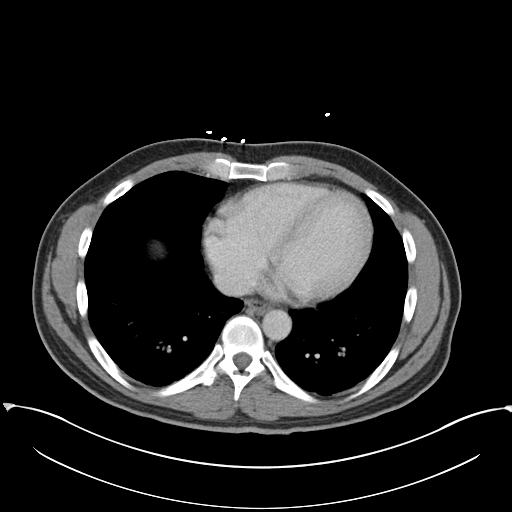

[Series 3: coronal a/|p · coronal · 0.76mm/px · 3 of 85 slices shown]
[im 29/85  soft-tissue]
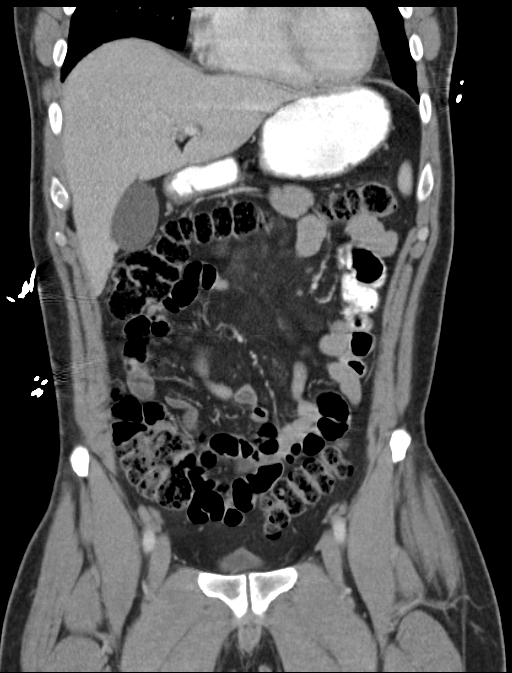
[im 38/85  soft-tissue]
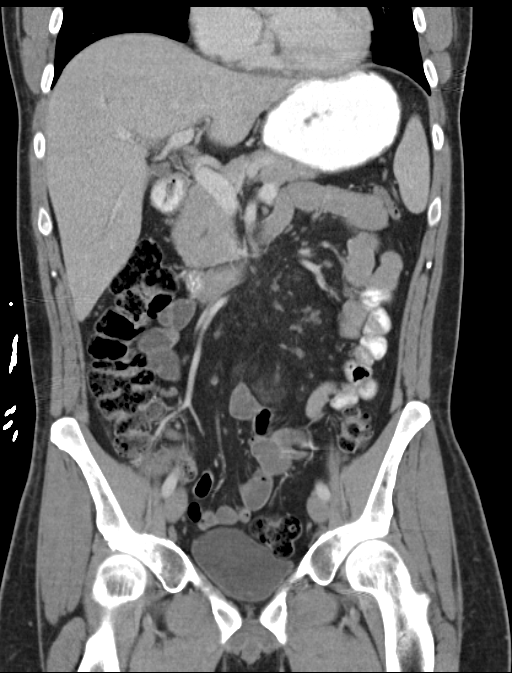
[im 47/85  soft-tissue]
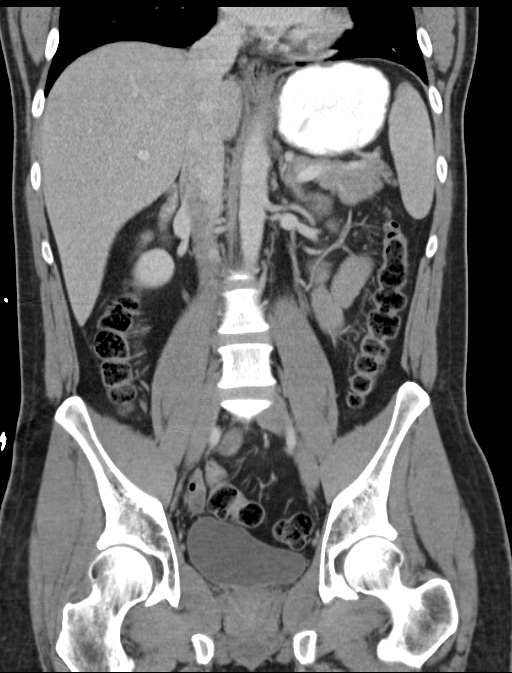

[16 of 46 positions shown; findings below may reference images not displayed]

Minimal
atelectatic appearing linear dependent base opacities bilaterally.

Hepatobiliary: There are normal appearances of the liver,
gallbladder and bile ducts.

Pancreas: Normal

Spleen: Normal

Adrenals/Urinary Tract: The adrenals and kidneys are normal in
appearance. There is no urinary calculus evident. There is no
hydronephrosis or ureteral dilatation. Collecting systems and
ureters appear unremarkable.

Stomach/Bowel: There are normal appearances of the stomach, small
bowel and colon. The appendix is normal.

Vascular/Lymphatic: The abdominal aorta is normal in caliber. There
is no atherosclerotic calcification. There is no adenopathy in the
retroperitoneum. There is a prominent number of physiologic -sized
nodes in the mesentery, nonspecific.

Reproductive: Unremarkable

Other: No acute inflammatory changes are evident in the abdomen or
pelvis. There is no ascites.

Musculoskeletal: There is a tiny fat containing umbilical hernia. No
significant musculoskeletal lesions are evident.
FINDINGS: Minimally prominent mesenteric nodes, nonspecific. Otherwise
unremarkable.

## 2016-07-30 ENCOUNTER — Ambulatory Visit (INDEPENDENT_AMBULATORY_CARE_PROVIDER_SITE_OTHER): Payer: 59 | Admitting: Family Medicine

## 2016-07-30 ENCOUNTER — Encounter: Payer: Self-pay | Admitting: Family Medicine

## 2016-07-30 VITALS — BP 104/60 | HR 70 | Temp 98.3°F | Resp 14 | Ht 74.0 in | Wt 204.0 lb

## 2016-07-30 DIAGNOSIS — H109 Unspecified conjunctivitis: Secondary | ICD-10-CM | POA: Diagnosis not present

## 2016-07-30 MED ORDER — POLYMYXIN B-TRIMETHOPRIM 10000-0.1 UNIT/ML-% OP SOLN: OPHTHALMIC | 0 refills | Status: DC

## 2016-07-30 NOTE — Patient Instructions (Signed)
F/U as needed

## 2016-07-30 NOTE — Progress Notes (Signed)
   Subjective:    Patient ID: Eric Davis, male    DOB: [birth year], 35 y.o.   MRN: [id number]  Patient presents for R Eye Irritation (x1 day- redness to eye- reports tenderness to eye) Patient here with redness and irritation to his right eye that started last night. He has not noted any drainage just yet. His vision is intact. He is not wearing any contacts. He denies any injury to the eye  No URI symptoms or allergies    Review Of Systems:  GEN- denies fatigue, fever, weight loss,weakness, recent illness HEENT- denies eye drainage, change in vision, nasal discharge, CVS- denies chest pain, palpitations RESP- denies SOB, cough, wheeze Neuro- denies headache, dizziness, syncope, seizure activity       Objective:    BP 104/60 (BP Location: Left Arm, Patient Position: Sitting, Cuff Size: Large)   Pulse 70   Temp 98.3 F (36.8 C) (Oral)   Resp 14   Ht 6\' 2"  (1.88 m)   Wt 204 lb (92.5 kg)   SpO2 98%   BMI 26.19 kg/m  GEN- NAD, alert and oriented x3 HEENT- PERRL, EOMI, MILD rIGHT  injected sclera, INJECTED RIGHT  conjunctiva, Left eye  Sclera noninjected, pink conjunctiva MMM, oropharynx clear Neck- Supple, LAD       Assessment & Plan:      Problem List Items Addressed This Visit    None    Visit Diagnoses    Bacterial conjunctivitis    -  Primary   Early conjunctivitis, has had in past, given polytrim, warm compresses, to eye       Note: This dictation was prepared with Dragon dictation along with smaller phrase technology. Any transcriptional errors that result from this process are unintentional.

## 2016-09-24 ENCOUNTER — Ambulatory Visit (INDEPENDENT_AMBULATORY_CARE_PROVIDER_SITE_OTHER): Payer: 59 | Admitting: Family Medicine

## 2016-09-24 VITALS — BP 110/74 | HR 82 | Temp 98.7°F | Resp 14 | Ht 74.0 in | Wt 208.0 lb

## 2016-09-24 DIAGNOSIS — J029 Acute pharyngitis, unspecified: Secondary | ICD-10-CM | POA: Diagnosis not present

## 2016-09-24 DIAGNOSIS — R509 Fever, unspecified: Secondary | ICD-10-CM

## 2016-09-24 LAB — STREP GROUP A AG, W/REFLEX TO CULT: STREGTOCOCCUS GROUP A AG SCREEN: NOT DETECTED

## 2016-09-24 MED ORDER — OSELTAMIVIR PHOSPHATE 75 MG PO CAPS: 75.0000 mg | ORAL_CAPSULE | Freq: Two times a day (BID) | ORAL | 0 refills | Status: DC

## 2016-09-24 NOTE — Progress Notes (Signed)
   Subjective:    Patient ID: Eric Davis, male    DOB: [birth year], 36 y.o.   MRN: [id number]  HPI The patient is concerned he has strep throat. The last time he felt this bad he had strep throat. His wife has the flu. 2 of his 3 sons have the flu. He reports 48 hours of body aches, fever, fatigue, dry cough, and mild sore throat. Strep test is negative No past medical history on file. Past Surgical History:  Procedure Laterality Date  . TONSILLECTOMY     No current outpatient prescriptions on file prior to visit.   No current facility-administered medications on file prior to visit.    No Known Allergies Social History   Social History  . Marital status: Married    Spouse name: N/A  . Number of children: N/A  . Years of education: N/A   Occupational History  . Not on file.   Social History Main Topics  . Smoking status: Never Smoker  . Smokeless tobacco: Never Used  . Alcohol use Yes     Comment: rarely  . Drug use: No  . Sexual activity: Not on file   Other Topics Concern  . Not on file   Social History Narrative   ** Merged History Encounter **          Review of Systems  All other systems reviewed and are negative.      Objective:   Physical Exam  Constitutional: He appears well-developed and well-nourished.  HENT:  Right Ear: Tympanic membrane, external ear and ear canal normal.  Left Ear: Tympanic membrane, external ear and ear canal normal.  Nose: Mucosal edema and rhinorrhea present.  Mouth/Throat: Oropharynx is clear and moist. No oropharyngeal exudate, posterior oropharyngeal edema, posterior oropharyngeal erythema or tonsillar abscesses.  Neck: Neck supple.  Cardiovascular: Normal rate, regular rhythm and normal heart sounds.   Pulmonary/Chest: Effort normal and breath sounds normal. No respiratory distress. He has no wheezes. He has no rales.  Abdominal: Soft. Bowel sounds are normal.  Lymphadenopathy:    He has no cervical adenopathy.    Vitals reviewed.         Assessment & Plan:  Sore throat - Plan: STREP GROUP A AG, W/REFLEX TO CULT  Fever chills - Plan: STREP GROUP A AG, W/REFLEX TO CULT  Patient's history and physical is more consistent with influenza. Begin ibuprofen 800 mg every 8 hours. Rest. Drink plenty of fluids. I did send the patient prescription for Tamiflu 75 mg by mouth twice a day for 5 days. He is at the very margin of the affective range for this medication. Therefore I recommended that the prescription was expensive not to get it area symptoms should improve over the next 5-7 days

## 2017-02-24 DIAGNOSIS — L600 Ingrowing nail: Secondary | ICD-10-CM | POA: Diagnosis not present

## 2017-07-08 DIAGNOSIS — Z23 Encounter for immunization: Secondary | ICD-10-CM | POA: Diagnosis not present

## 2018-06-04 DIAGNOSIS — M25521 Pain in right elbow: Secondary | ICD-10-CM | POA: Diagnosis not present

## 2018-06-25 DIAGNOSIS — Z23 Encounter for immunization: Secondary | ICD-10-CM | POA: Diagnosis not present

## 2018-10-02 DIAGNOSIS — L600 Ingrowing nail: Secondary | ICD-10-CM | POA: Diagnosis not present

## 2018-10-02 DIAGNOSIS — M25774 Osteophyte, right foot: Secondary | ICD-10-CM | POA: Diagnosis not present

## 2019-01-22 ENCOUNTER — Encounter: Payer: Self-pay | Admitting: Family Medicine

## 2019-01-22 ENCOUNTER — Ambulatory Visit (INDEPENDENT_AMBULATORY_CARE_PROVIDER_SITE_OTHER): Payer: 59 | Admitting: Family Medicine

## 2019-01-22 ENCOUNTER — Other Ambulatory Visit: Payer: Self-pay

## 2019-01-22 VITALS — BP 118/82 | HR 57 | Temp 98.0°F | Resp 18 | Ht 75.0 in | Wt 209.8 lb

## 2019-01-22 DIAGNOSIS — Z0000 Encounter for general adult medical examination without abnormal findings: Secondary | ICD-10-CM | POA: Diagnosis not present

## 2019-01-22 DIAGNOSIS — Z13220 Encounter for screening for lipoid disorders: Secondary | ICD-10-CM | POA: Diagnosis not present

## 2019-01-22 NOTE — Progress Notes (Signed)
Patient: Eric Furlongndrew S Gladue, Male    DOB: 11-Oct-1980, 38 y.o.   MRN: [id number] Visit Date: 01/22/2019  Today's Provider: Danelle BerryLeisa Aamilah Augenstein, PA-C   Chief Complaint  Patient presents with  . Annual Exam   Subjective:    Annual physical exam Eric Davis is a 38 y.o. male who presents today for health maintenance and complete physical. He feels well. He reports exercising none - has a lot of physical demand at work. He reports he is sleeping well.  -----------------------------------------------------------------  No health concerns - hasn't done a physical or lab work here, but he has had DOT physicals done PCP Dr. Tanya NonesPickard, will stay with him as PCP  Pt denies any PMHx, no meds or problems Reviewed family hx, father had HLD for a short time but then lost weight and was able to stop meds Mom has some thyroid disease Twin brother is also healthy, only has some "stomach issues" but no other known chronic diseases  He denies any weight changes, hair/skin/bowel changes, urinary sx.  He is married with three children  Bradycardic here - hx of similar HR in the past, he denies any CP/pressure, lightheaded episodes, dizziness, exertional SOB/CP, fatigue, orthopnea, PND, LE edema, palpitations  Review of Systems  Constitutional: Negative.  Negative for activity change, appetite change, fatigue and unexpected weight change.  HENT: Negative.   Eyes: Negative.   Respiratory: Negative.  Negative for chest tightness and shortness of breath.   Cardiovascular: Negative.  Negative for chest pain, palpitations and leg swelling.  Gastrointestinal: Negative.  Negative for abdominal pain and blood in stool.  Endocrine: Negative.   Genitourinary: Negative.  Negative for decreased urine volume, difficulty urinating, testicular pain and urgency.  Musculoskeletal: Negative.   Skin: Negative.  Negative for color change and pallor.  Allergic/Immunologic: Negative.   Neurological: Negative.  Negative for  syncope, weakness, light-headedness and numbness.  Hematological: Negative.   Psychiatric/Behavioral: Negative.  Negative for confusion, dysphoric mood, self-injury and suicidal ideas. The patient is not nervous/anxious.   All other systems reviewed and are negative.   Social History      He  reports that he has never smoked. He has never used smokeless tobacco. He reports current alcohol use. He reports that he does not use drugs.       Social History   Socioeconomic History  . Marital status: Married    Spouse name: Not on file  . Number of children: Not on file  . Years of education: Not on file  . Highest education level: Not on file  Occupational History  . Not on file  Social Needs  . Financial resource strain: Not on file  . Food insecurity    Worry: Not on file    Inability: Not on file  . Transportation needs    Medical: Not on file    Non-medical: Not on file  Tobacco Use  . Smoking status: Never Smoker  . Smokeless tobacco: Never Used  Substance and Sexual Activity  . Alcohol use: Yes    Comment: rarely  . Drug use: No  . Sexual activity: Not on file  Lifestyle  . Physical activity    Days per week: Not on file    Minutes per session: Not on file  . Stress: Not on file  Relationships  . Social Musicianconnections    Talks on phone: Not on file    Gets together: Not on file    Attends religious service: Not on file  Active member of club or organization: Not on file    Attends meetings of clubs or organizations: Not on file    Relationship status: Not on file  Other Topics Concern  . Not on file  Social History Narrative   ** Merged History Encounter **        No past medical history on file.   Patient Active Problem List   Diagnosis Date Noted  . Chest pain 12/17/2014  . Abdominal pain 12/16/2014  . Abdominal pain in male   . Febrile illness   . Ingrown toenail 04/25/2013  . Infected nailbed of toe 04/25/2013    Past Surgical History:   Procedure Laterality Date  . TONSILLECTOMY      Family History        Family Status  Relation Name Status  . Mother  Alive  . Father  Alive  . MGM  Alive  . MGF  Deceased  . PGM  Deceased  . PGF  Deceased        His family history includes Lung disease in his maternal grandfather.      No Known Allergies  No current outpatient medications on file.   Patient Care Team: Donita BrooksPickard, Warren T, MD as PCP - General (Family Medicine) Donita BrooksPickard, Warren T, MD (Family Medicine)      Objective:   Vitals: BP 118/82   Pulse (!) 57   Temp 98 F (36.7 C)   Resp 18   Ht 6\' 3"  (1.905 m)   Wt 209 lb 12.8 oz (95.2 kg)   SpO2 96%   BMI 26.22 kg/m    Vitals:   01/22/19 0930  BP: 118/82  Pulse: (!) 57  Resp: 18  Temp: 98 F (36.7 C)  SpO2: 96%  Weight: 209 lb 12.8 oz (95.2 kg)  Height: 6\' 3"  (1.905 m)     Physical Exam Vitals signs and nursing note reviewed.  Constitutional:      General: He is not in acute distress.    Appearance: Normal appearance. He is well-developed and normal weight. He is not ill-appearing, toxic-appearing or diaphoretic.  HENT:     Head: Normocephalic and atraumatic.     Jaw: No trismus.     Right Ear: Tympanic membrane, ear canal and external ear normal.     Left Ear: Tympanic membrane, ear canal and external ear normal.     Nose: Nose normal. No mucosal edema or rhinorrhea.     Right Sinus: No maxillary sinus tenderness or frontal sinus tenderness.     Left Sinus: No maxillary sinus tenderness or frontal sinus tenderness.     Mouth/Throat:     Mouth: Mucous membranes are moist.     Pharynx: Oropharynx is clear. Uvula midline. No oropharyngeal exudate, posterior oropharyngeal erythema or uvula swelling.  Eyes:     General: Lids are normal. No scleral icterus.       Right eye: No discharge.        Left eye: No discharge.     Conjunctiva/sclera: Conjunctivae normal.     Pupils: Pupils are equal, round, and reactive to light.  Neck:      Musculoskeletal: Normal range of motion and neck supple. No neck rigidity.     Thyroid: No thyroid mass, thyromegaly or thyroid tenderness.     Trachea: Trachea and phonation normal. No tracheal deviation.  Cardiovascular:     Rate and Rhythm: Normal rate and regular rhythm.     Pulses: Normal pulses.  Radial pulses are 2+ on the right side and 2+ on the left side.       Posterior tibial pulses are 2+ on the right side and 2+ on the left side.     Heart sounds: Normal heart sounds. No murmur. No friction rub. No gallop.   Pulmonary:     Effort: Pulmonary effort is normal. No respiratory distress.     Breath sounds: Normal breath sounds. No stridor. No wheezing, rhonchi or rales.  Abdominal:     General: Bowel sounds are normal. There is no distension.     Palpations: Abdomen is soft.     Tenderness: There is no abdominal tenderness. There is no guarding or rebound.  Musculoskeletal: Normal range of motion.     Right lower leg: No edema.     Left lower leg: No edema.  Lymphadenopathy:     Cervical: No cervical adenopathy.  Skin:    General: Skin is warm and dry.     Capillary Refill: Capillary refill takes less than 2 seconds.     Coloration: Skin is jaundiced. Skin is not pale.     Findings: No rash.  Neurological:     Mental Status: He is alert.     Motor: No abnormal muscle tone.     Coordination: Coordination normal.     Gait: Gait normal.  Psychiatric:        Mood and Affect: Mood normal.        Speech: Speech normal.        Behavior: Behavior normal.      Depression Screen PHQ 2/9 Scores 01/22/2019  PHQ - 2 Score 0     Office Visit from 01/22/2019 in [facility]  AUDIT-C Score  2        Assessment & Plan:     Routine Health Maintenance and Physical Exam  Exercise Activities and Dietary recommendations:  Recommended AHA exercise guidelines, increase aerobic exercise in addition to his physical demands at work. He was a little concerned  about cholesterol, what to do to prevent high cholesterol - discussed diet for cholesterol - avoid trans/saturated fat, increase whole grain and vegetables, continue to maintain healthy weight/BMI, add aerobic exercise.  Discussed health benefits of physical activity, and encouraged him to engage in regular exercise appropriate for his age and condition.   Immunization History  Administered Date(s) Administered  . Influenza,inj,Quad PF,6+ Mos 07/08/2017    Health Maintenance  Topic Date Due  . HIV Screening  10/22/1993  . TETANUS/TDAP  10/22/1997  . INFLUENZA VACCINE  03/13/2019  Pt wishes to return in 1-2 weeks for fasting labs and Tdap - he is going on vacation and would like to enjoy it.    A. Screening Labs: Get baseline CBC, CMP and FLP  B. Screening For Prostate Cancer: No sx, no screening per age  C. Screening For Colorectal Cancer:  No risk factors, due at 40 y/o  D. Immunizations: Flu - due aug 2020 Tetanus - due now, will return to complete Pneumococcal - no indication until 38 y/o Shingrix - not indicated for age          Delsa Grana, Hershal Coria 01/22/19 9:46 AM  [facility]

## 2019-01-22 NOTE — Patient Instructions (Signed)

## 2019-02-02 ENCOUNTER — Ambulatory Visit: Payer: 59

## 2019-02-02 ENCOUNTER — Other Ambulatory Visit: Payer: Self-pay

## 2019-02-02 ENCOUNTER — Other Ambulatory Visit: Payer: 59

## 2019-02-02 DIAGNOSIS — Z0000 Encounter for general adult medical examination without abnormal findings: Secondary | ICD-10-CM

## 2019-02-02 DIAGNOSIS — Z13220 Encounter for screening for lipoid disorders: Secondary | ICD-10-CM

## 2019-02-03 LAB — CBC WITH DIFFERENTIAL/PLATELET
Absolute Monocytes: 466 cells/uL (ref 200–950)
Basophils Absolute: 20 cells/uL (ref 0–200)
Basophils Relative: 0.4 %
Eosinophils Absolute: 108 cells/uL (ref 15–500)
Eosinophils Relative: 2.2 %
HCT: 42.1 % (ref 38.5–50.0)
Hemoglobin: 14.2 g/dL (ref 13.2–17.1)
Lymphs Abs: 1387 cells/uL (ref 850–3900)
MCH: 29.2 pg (ref 27.0–33.0)
MCHC: 33.7 g/dL (ref 32.0–36.0)
MCV: 86.4 fL (ref 80.0–100.0)
MPV: 10.1 fL (ref 7.5–12.5)
Monocytes Relative: 9.5 %
Neutro Abs: 2920 cells/uL (ref 1500–7800)
Neutrophils Relative %: 59.6 %
Platelets: 222 10*3/uL (ref 140–400)
RBC: 4.87 10*6/uL (ref 4.20–5.80)
RDW: 12.7 % (ref 11.0–15.0)
Total Lymphocyte: 28.3 %
WBC: 4.9 10*3/uL (ref 3.8–10.8)

## 2019-02-03 LAB — COMPLETE METABOLIC PANEL WITH GFR
AG Ratio: 1.8 (calc) (ref 1.0–2.5)
ALT: 36 U/L (ref 9–46)
AST: 34 U/L (ref 10–40)
Albumin: 4.4 g/dL (ref 3.6–5.1)
Alkaline phosphatase (APISO): 58 U/L (ref 36–130)
BUN: 18 mg/dL (ref 7–25)
CO2: 28 mmol/L (ref 20–32)
Calcium: 9.2 mg/dL (ref 8.6–10.3)
Chloride: 103 mmol/L (ref 98–110)
Creat: 0.95 mg/dL (ref 0.60–1.35)
GFR, Est African American: 116 mL/min/{1.73_m2} (ref >=60)
GFR, Est Non African American: 100 mL/min/{1.73_m2} (ref >=60)
Globulin: 2.4 g/dL (calc) (ref 1.9–3.7)
Glucose, Bld: 94 mg/dL (ref 65–99)
Potassium: 4.5 mmol/L (ref 3.5–5.3)
Sodium: 136 mmol/L (ref 135–146)
Total Bilirubin: 0.7 mg/dL (ref 0.2–1.2)
Total Protein: 6.8 g/dL (ref 6.1–8.1)

## 2019-02-03 LAB — LIPID PANEL
Cholesterol: 191 mg/dL (ref <200)
HDL: 71 mg/dL (ref >=40)
LDL Cholesterol (Calc): 101 mg/dL (calc) — ABNORMAL HIGH
Non-HDL Cholesterol (Calc): 120 mg/dL (calc) (ref <130)
Total CHOL/HDL Ratio: 2.7 (calc) (ref <5.0)
Triglycerides: 98 mg/dL (ref <150)

## 2019-04-28 ENCOUNTER — Other Ambulatory Visit: Payer: Self-pay

## 2019-04-28 DIAGNOSIS — U071 COVID-19: Secondary | ICD-10-CM

## 2019-04-29 LAB — NOVEL CORONAVIRUS, NAA: SARS-CoV-2, NAA: NOT DETECTED

## 2019-05-27 ENCOUNTER — Encounter: Payer: 59 | Admitting: Family Medicine

## 2019-05-27 ENCOUNTER — Other Ambulatory Visit: Payer: Self-pay

## 2019-05-27 ENCOUNTER — Ambulatory Visit (INDEPENDENT_AMBULATORY_CARE_PROVIDER_SITE_OTHER): Payer: 59 | Admitting: Family Medicine

## 2019-05-27 DIAGNOSIS — T754XXA Electrocution, initial encounter: Secondary | ICD-10-CM | POA: Diagnosis not present

## 2019-05-27 NOTE — Progress Notes (Signed)
Subjective:    Patient ID: Eric Davis, male    DOB: 1981/04/16, 38 y.o.   MRN: [id number]  HPI Patient is a very pleasant 38 year old Caucasian male who was working at his business approximately 2 hours ago.  He was operating a heavy piece of equipment.  The equipment struck a power line.  The electricity went through the power line down the piece of equipment and through the wiring through the electronic controls that were in his hands.  He suffered a small blister on the finger pad of both index fingers which was operating the electrical controls.  He did not lose consciousness.  He did not experience any seizure activity.  He experienced a few seconds of electrical shock and then was able to release the electrical controls.  He experienced no tachycardia.  He experienced no syncope or near syncope.  He denies any chest pain.  He denies any shortness of breath.  He denies any pleurisy.  He denies any dyspnea on exertion.  EKG was obtained today which shows normal sinus rhythm at 66 bpm.  He has a normal intervals and normal axis.  He does have inverted T waves in lead V1 and V2.  However he denies any symptoms of ischemia.  He has no angina or shortness of breath.  Event occurred more than 2 hours ago and he is completely asymptomatic.  He denies any muscle aches.  He denies any headache.  He denies any neurologic deficit. Past Medical History:  Diagnosis Date   Abdominal pain 12/16/2014   Abdominal pain in male    Chest pain 12/17/2014   Febrile illness    Infected nailbed of toe 04/25/2013   Ingrown toenail 04/25/2013   Past Surgical History:  Procedure Laterality Date   TONSILLECTOMY     No current outpatient medications on file prior to visit.   No current facility-administered medications on file prior to visit.    No Known Allergies Social History   Socioeconomic History   Marital status: Married    Spouse name: Not on file   Number of children: 3   Years of education:  Not on file   Highest education level: Not on file  Occupational History   Not on file  Social Needs   Financial resource strain: Not on file   Food insecurity    Worry: Not on file    Inability: Not on file   Transportation needs    Medical: Not on file    Non-medical: Not on file  Tobacco Use   Smoking status: Never Smoker   Smokeless tobacco: Never Used  Substance and Sexual Activity   Alcohol use: Yes    Alcohol/week: 1.0 standard drinks    Types: 1 Cans of beer per week    Comment: rarely   Drug use: No   Sexual activity: Yes  Lifestyle   Physical activity    Days per week: 0 days    Minutes per session: Not on file   Stress: Not on file  Relationships   Social connections    Talks on phone: Not on file    Gets together: Not on file    Attends religious service: Not on file    Active member of club or organization: Not on file    Attends meetings of clubs or organizations: Not on file    Relationship status: Not on file   Intimate partner violence    Fear of current or ex partner: Not on  file    Emotionally abused: Not on file    Physically abused: Not on file    Forced sexual activity: Not on file  Other Topics Concern   Not on file  Social History Narrative   ** Merged History Encounter **          Review of Systems  All other systems reviewed and are negative.      Objective:   Physical Exam Vitals signs reviewed.  Constitutional:      General: He is not in acute distress.    Appearance: Normal appearance. He is not ill-appearing, toxic-appearing or diaphoretic.  HENT:     Head: Normocephalic and atraumatic.     Mouth/Throat:     Mouth: Mucous membranes are moist.     Pharynx: Oropharynx is clear.  Eyes:     Extraocular Movements: Extraocular movements intact.     Conjunctiva/sclera: Conjunctivae normal.     Pupils: Pupils are equal, round, and reactive to light.  Cardiovascular:     Rate and Rhythm: Normal rate.      Pulses: Normal pulses.     Heart sounds: Normal heart sounds. No murmur. No friction rub. No gallop.   Pulmonary:     Effort: Pulmonary effort is normal. No respiratory distress.     Breath sounds: Normal breath sounds. No stridor. No wheezing, rhonchi or rales.  Chest:     Chest wall: No tenderness.  Abdominal:     General: Abdomen is flat. Bowel sounds are normal.     Palpations: Abdomen is soft.  Skin:    Findings: Lesion present. No bruising or erythema.  Neurological:     General: No focal deficit present.     Mental Status: He is alert and oriented to person, place, and time.     Cranial Nerves: No cranial nerve deficit.     Sensory: No sensory deficit.     Motor: No weakness.     Coordination: Coordination normal.     Gait: Gait normal.     Deep Tendon Reflexes: Reflexes normal.     Patient has small 1 to 2 mm blisters on the finger pad of both index fingers.      Assessment & Plan:  Electrocution and nonfatal effects of electric current, initial encounter - Plan: EKG 12-Lead  Patient's physical exam is completely normal.  Events occurred more than 2 hours ago.  He is completely asymptomatic.  He denies any syncope, tachycardia, palpitations, or lightheadedness.  There was no seizure activity.  He denies any neurologic deficits.  EKG today is normal except for inverted T waves in V1 and V2 which I believe are not related to ischemia given his lack of symptoms.  I will check a CBC, CMP, and a creatine kinase level to evaluate for possible risk of rhabdomyolysis due to electrocution.  However the patient denies any seizure activity and he denies any muscle aches at the present time therefore I believe this is highly unlikely.  Therefore from my standpoint, the patient appears to be free of any injury aside from some small burns to the finger pads of both index fingers.  He can treat this with a Band-Aid and Neosporin.  Recheck immediately if any symptoms arise.

## 2019-05-27 NOTE — Progress Notes (Signed)
This encounter was created in error - please disregard.

## 2019-05-27 NOTE — Progress Notes (Signed)
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 Discussed with Dr.Pickard and was told to perform and EKG to check his heart rhythm/ EKG performed. Dr.Pickard seen patient for an office visit.

## 2019-05-28 LAB — COMPLETE METABOLIC PANEL WITH GFR
AG Ratio: 1.8 (calc) (ref 1.0–2.5)
ALT: 21 U/L (ref 9–46)
AST: 22 U/L (ref 10–40)
Albumin: 4.4 g/dL (ref 3.6–5.1)
Alkaline phosphatase (APISO): 55 U/L (ref 36–130)
BUN: 17 mg/dL (ref 7–25)
CO2: 28 mmol/L (ref 20–32)
Calcium: 9.4 mg/dL (ref 8.6–10.3)
Chloride: 103 mmol/L (ref 98–110)
Creat: 0.93 mg/dL (ref 0.60–1.35)
GFR, Est African American: 119 mL/min/{1.73_m2} (ref >=60)
GFR, Est Non African American: 102 mL/min/{1.73_m2} (ref >=60)
Globulin: 2.4 g/dL (calc) (ref 1.9–3.7)
Glucose, Bld: 147 mg/dL — ABNORMAL HIGH (ref 65–99)
Potassium: 4.2 mmol/L (ref 3.5–5.3)
Sodium: 138 mmol/L (ref 135–146)
Total Bilirubin: 0.7 mg/dL (ref 0.2–1.2)
Total Protein: 6.8 g/dL (ref 6.1–8.1)

## 2019-05-28 LAB — CK: Total CK: 253 U/L — ABNORMAL HIGH (ref 44–196)

## 2019-06-01 ENCOUNTER — Other Ambulatory Visit: Payer: 59

## 2019-06-01 ENCOUNTER — Other Ambulatory Visit: Payer: Self-pay

## 2019-06-01 DIAGNOSIS — Z13220 Encounter for screening for lipoid disorders: Secondary | ICD-10-CM

## 2019-06-01 DIAGNOSIS — R7309 Other abnormal glucose: Secondary | ICD-10-CM

## 2019-06-02 LAB — BASIC METABOLIC PANEL
BUN: 16 mg/dL (ref 7–25)
CO2: 25 mmol/L (ref 20–32)
Calcium: 9.2 mg/dL (ref 8.6–10.3)
Chloride: 105 mmol/L (ref 98–110)
Creat: 0.95 mg/dL (ref 0.60–1.35)
Glucose, Bld: 95 mg/dL (ref 65–99)
Potassium: 4.4 mmol/L (ref 3.5–5.3)
Sodium: 140 mmol/L (ref 135–146)

## 2019-06-02 LAB — LIPID PANEL
Cholesterol: 156 mg/dL (ref <200)
HDL: 58 mg/dL (ref >=40)
LDL Cholesterol (Calc): 83 mg/dL (calc)
Non-HDL Cholesterol (Calc): 98 mg/dL (calc) (ref <130)
Total CHOL/HDL Ratio: 2.7 (calc) (ref <5.0)
Triglycerides: 66 mg/dL (ref <150)

## 2019-06-08 ENCOUNTER — Other Ambulatory Visit: Payer: 59

## 2023-09-19 ENCOUNTER — Telehealth: Payer: Self-pay | Admitting: Family Medicine

## 2023-09-19 NOTE — Telephone Encounter (Signed)
 Copied from CRM [id number]. Topic: General - Other >> Sep 19, 2023  2:31 PM Pattie Borders B wrote: Reason for CRM: Patient calling would like to speak with Dr. Cheril Cork directly, personal in matter [phone]
